# Patient Record
Sex: Female | Born: 1990
Health system: Southern US, Community
[De-identification: ages and names within clinical notes are randomized; demographics above are authoritative.]

## PROBLEM LIST (undated history)

## (undated) DIAGNOSIS — G35 Multiple sclerosis: Secondary | ICD-10-CM

## (undated) HISTORY — PX: NO PAST SURGERIES: SHX2092

---
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
## 2010-09-29 LAB — URINE MICROSCOPIC-ADD ON

## 2010-09-29 LAB — I-STAT 8, (EC8 V) (CONVERTED LAB)
Bicarbonate: 26.1 — ABNORMAL HIGH
HCT: 49 — ABNORMAL HIGH
Hemoglobin: 16.7 — ABNORMAL HIGH
Operator id: 161631
Sodium: 138
TCO2: 28
pCO2, Ven: 47.4

## 2010-09-29 LAB — URINALYSIS, ROUTINE W REFLEX MICROSCOPIC
Glucose, UA: NEGATIVE
Ketones, ur: 15 — AB
Leukocytes, UA: NEGATIVE
Protein, ur: 30 — AB
pH: 5.5

## 2010-09-29 LAB — POCT PREGNANCY, URINE: Operator id: 161631

## 2011-08-19 ENCOUNTER — Emergency Department (HOSPITAL_COMMUNITY): Payer: BC Managed Care – PPO

## 2011-08-19 ENCOUNTER — Emergency Department (HOSPITAL_COMMUNITY)
Admission: EM | Admit: 2011-08-19 | Discharge: 2011-08-19 | Disposition: A | Discharge status: Home / Self Care | Payer: BC Managed Care – PPO | Attending: Emergency Medicine | Admitting: Emergency Medicine

## 2011-08-19 ENCOUNTER — Encounter (HOSPITAL_COMMUNITY): Payer: Self-pay | Admitting: Emergency Medicine

## 2011-08-19 DIAGNOSIS — H538 Other visual disturbances: Secondary | ICD-10-CM | POA: Insufficient documentation

## 2011-08-19 LAB — GLUCOSE, CAPILLARY: Glucose-Capillary: 108 mg/dL — ABNORMAL HIGH (ref 70–99)

## 2011-08-19 MED ORDER — TETRACAINE HCL 0.5 % OP SOLN
1.0000 [drp] | Freq: Once | OPHTHALMIC | Status: AC
  Administered 2011-08-19: 1 [drp] via OPHTHALMIC
  Filled 2011-08-19: qty 2

## 2011-08-19 NOTE — ED Notes (Signed)
Pt sent to ed by her eye doctor for diabetes testing pt states she woke up with blurred vision and went to her eye doctor and he noted vision changes and stated that it might be due to diabetes. Pt denies frequent urination, and thirst

## 2011-08-19 NOTE — ED Notes (Addendum)
Pt reports that she went back to the eye doctor today and he suggested that she come get her BP and CBG checked. Pt reports that she is currently on VIORELE.Pt reports that she tried putting eye drops in her eyes earlier with no relief.Pt denies pain or drainage from either eye. Pt reports a family history of HTN and diabetes.

## 2011-08-19 NOTE — ED Provider Notes (Signed)
History     CSN: [id number]  Arrival date & time 08/19/11  4098   First MD Initiated Contact with Patient 08/19/11 2002      Chief Complaint  Patient presents with  . Blurred Vision    (Consider location/radiation/quality/duration/timing/severity/associated sxs/prior treatment) HPI  A 21 year old female presenting with acute onset of blurred vision upon waking this morning. Patient has had no similar prior episodes. She notes that she had a migraine several days ago which is now resolved patient denies eye pain, redness, discharge. She normally wears glasses and has not worn contacts in the last week. She states that she does have photophobia but that is normal she is very light sensitive at her baseline. Patient went to her optometrist who was concerned that her vision had declining sharply since her last visit 2 months ago and encourage her to present for evaluation.  History reviewed. No pertinent past medical history.  History reviewed. No pertinent past surgical history.  No family history on file.  History  Substance Use Topics  . Smoking status: Never Smoker   . Smokeless tobacco: Not on file  . Alcohol Use: No     occassionally    OB History    Grav Para Term Preterm Abortions TAB SAB Ect Mult Living                  Review of Systems  Constitutional: Negative for fever.  Eyes: Positive for visual disturbance. Negative for photophobia, pain, discharge, redness and itching.  All other systems reviewed and are negative.    Allergies  Review of patient's allergies indicates no known allergies.  Home Medications   Current Outpatient Rx  Name Route Sig Dispense Refill  . ACETAMINOPHEN 500 MG PO TABS Oral Take 1,000 mg by mouth every 6 (six) hours as needed.    . DESOGESTREL-ETHINYL ESTRADIOL 0.15-0.02/0.01 MG (21/5) PO TABS Oral Take 1 tablet by mouth daily.      BP 129/93  Pulse 79  Temp 99.4 F (37.4 C) (Oral)  Resp 18  SpO2 100%  LMP  07/25/2011  Physical Exam  Nursing note and vitals reviewed. Constitutional: She is oriented to person, place, and time. She appears well-developed and well-nourished. No distress.  HENT:  Head: Normocephalic.  Eyes: Conjunctivae and lids are normal. Pupils are equal, round, and reactive to light. Right eye exhibits no chemosis, no discharge, no exudate and no hordeolum. No foreign body present in the right eye. Left eye exhibits no chemosis, no discharge, no exudate and no hordeolum. No foreign body present in the left eye. Right conjunctiva is not injected. Right conjunctiva has no hemorrhage. Left conjunctiva is not injected. Left conjunctiva has no hemorrhage. No scleral icterus. Right eye exhibits nystagmus. Right eye exhibits normal extraocular motion. Left eye exhibits nystagmus. Left eye exhibits normal extraocular motion. Right pupil is round and reactive. Left pupil is round and reactive.  Fundoscopic exam:      The right eye shows no AV nicking, no exudate, no hemorrhage and no papilledema. The right eye shows red reflex.      The left eye shows no AV nicking, no exudate, no hemorrhage and no papilledema. The left eye shows red reflex. Slit lamp exam:      The right eye shows no corneal abrasion, no corneal flare, no corneal ulcer, no foreign body, no hyphema, no hypopyon, no fluorescein uptake and no anterior chamber bulge.       The left eye shows no corneal abrasion,  no corneal flare, no corneal ulcer, no foreign body, no hyphema, no hypopyon, no fluorescein uptake and no anterior chamber bulge.       Patient does not have smooth pursuit, with both vertical and horizontal nystagmus on extraocular movement. Patient states that she has had this all of her life.  OS 20/70 Corrected  OD 20/100 Corrected  .   Cardiovascular: Normal rate.   Pulmonary/Chest: Effort normal.  Musculoskeletal: Normal range of motion.  Neurological: She is alert and oriented to person, place,  and time.  Psychiatric: She has a normal mood and affect.    ED Course  Procedures (including critical care time)  Labs Reviewed  GLUCOSE, CAPILLARY - Abnormal; Notable for the following:    Glucose-Capillary 108 (*)     All other components within normal limits   No results found.   1. Blurred vision, bilateral       MDM  Acute onset of blurred vision this a.m. Patient denies any other symptoms. Eye exam is normal blood glucose is normal CT scan is negative consult from ophthalmologist Dr. Pershing Proud appreciated who recommends seeing her in the morning at his clinic. Shared visit with attending Dr. Freida Busman  Pt verbalized understanding and agrees with care plan. Outpatient follow-up and return precautions given.            Wynetta Emery, PA-C 08/19/11 2309

## 2011-08-19 NOTE — ED Provider Notes (Signed)
Medical screening examination/treatment/procedure(s) were conducted as a shared visit with non-physician practitioner(s) and myself.  I personally evaluated the patient during the encounter  Patient was examined and sclera are normal. Corneas are clear. Funduscopic exam is negative. No eye drainage or discharge. Conjunctivae are normal. Visual acuity noted. Patient will be given referral to ophthalmology in the morning  Toy Baker, MD 08/19/11 2210

## 2011-08-19 NOTE — ED Provider Notes (Signed)
Medical screening examination/treatment/procedure(s) were conducted as a shared visit with non-physician practitioner(s) and myself.  I personally evaluated the patient during the encounter  Toy Baker, MD 08/19/11 [phone]

## 2011-08-19 NOTE — ED Notes (Signed)
Dr Allen at bedside  

## 2011-08-21 ENCOUNTER — Ambulatory Visit
Admission: RE | Admit: 2011-08-21 | Discharge: 2011-08-21 | Disposition: A | Discharge status: Home / Self Care | Payer: BC Managed Care – PPO | Source: Ambulatory Visit | Attending: Ophthalmology | Admitting: Ophthalmology

## 2011-08-21 ENCOUNTER — Other Ambulatory Visit: Payer: Self-pay | Admitting: Ophthalmology

## 2011-08-21 DIAGNOSIS — H547 Unspecified visual loss: Secondary | ICD-10-CM

## 2011-08-21 MED ORDER — GADOBENATE DIMEGLUMINE 529 MG/ML IV SOLN
14.0000 mL | Freq: Once | INTRAVENOUS | Status: AC | PRN
  Administered 2011-08-21: 14 mL via INTRAVENOUS

## 2011-08-27 ENCOUNTER — Other Ambulatory Visit: Payer: Self-pay | Admitting: Neurology

## 2011-08-28 ENCOUNTER — Ambulatory Visit
Admission: RE | Admit: 2011-08-28 | Discharge: 2011-08-28 | Disposition: A | Discharge status: Home / Self Care | Payer: BC Managed Care – PPO | Source: Ambulatory Visit | Attending: Neurology | Admitting: Neurology

## 2011-08-28 ENCOUNTER — Other Ambulatory Visit: Payer: Self-pay | Admitting: Neurology

## 2011-08-28 VITALS — BP 133/87 | HR 79 | Ht 67.0 in | Wt 157.0 lb

## 2011-08-28 LAB — CSF CELL COUNT WITH DIFFERENTIAL
RBC Count, CSF: 0 cu mm
Tube #: 3
WBC, CSF: 0 cu mm (ref 0–5)

## 2011-08-28 LAB — PROTEIN, CSF: Total Protein, CSF: 29 mg/dL (ref 15–45)

## 2011-08-28 NOTE — Progress Notes (Signed)
Two tiger-top tubes of blood drawn per orders from left [facility] space without difficulty; site unremarkable.  Victory Dakin, RN

## 2011-08-29 LAB — ANGIOTENSIN CONVERTING ENZYME, CSF: ACE, CSF: 1 U/L

## 2011-08-29 LAB — CRYPTOCOCCAL ANTIGEN, CSF: Crypto Ag: NEGATIVE

## 2011-08-31 ENCOUNTER — Telehealth: Payer: Self-pay | Admitting: Radiology

## 2011-08-31 LAB — CNS IGG SYNTHESIS RATE, CSF+BLOOD
Albumin, CSF: 12.6 mg/dL (ref 8.0–42.0)
IgG Index, CSF: 0.54 (ref <0.66)
IgG, CSF: 1.9 mg/dL (ref 0.8–7.7)
MS CNS IgG Synthesis Rate: -3 mg/24 h (ref <=3.3)

## 2011-08-31 NOTE — Telephone Encounter (Signed)
Explained back discomfort should go away in a few days, use OTC pain meds and ice to back for this. No other problems mentioned.

## 2011-09-01 LAB — OLIGOCLONAL BANDS, CSF + SERM

## 2012-04-05 ENCOUNTER — Other Ambulatory Visit: Payer: Self-pay | Admitting: Neurology

## 2012-04-05 DIAGNOSIS — H532 Diplopia: Secondary | ICD-10-CM

## 2012-04-13 ENCOUNTER — Ambulatory Visit (INDEPENDENT_AMBULATORY_CARE_PROVIDER_SITE_OTHER): Payer: BC Managed Care – PPO

## 2012-04-13 DIAGNOSIS — H532 Diplopia: Secondary | ICD-10-CM

## 2012-04-14 ENCOUNTER — Telehealth: Payer: Self-pay | Admitting: Neurology

## 2012-04-14 ENCOUNTER — Other Ambulatory Visit: Payer: Self-pay | Admitting: Neurology

## 2012-04-14 DIAGNOSIS — H532 Diplopia: Secondary | ICD-10-CM

## 2012-04-14 NOTE — Procedures (Signed)
[facility]  NEUROIMAGING REPORT   STUDY DATE: 04/13/12 PATIENT NAME: Denise Key DOB: [birth year] MRN: [id number]  ORDERING CLINICIAN: York Spaniel, MD CLINICAL HISTORY: 22 year old female with double vision and abnormal MRI brain.  EXAM: MRI brain (without)  TECHNIQUE: MRI of the brain without contrast was obtained utilizing 5 mm axial slices with T1, T2, T2 flair, T2 star gradient echo and diffusion weighted views.  T1 sagittal and T2 coronal views were obtained. CONTRAST: no IMAGING SITE: [facility] [address]   FINDINGS:  There is a round right mid-temporal lesion (1.8cm), T2 and T2FLAIR hyperintense. No abnormal lesions are seen on diffusion-weighted views to suggest acute ischemia. The cortical sulci, fissures and cisterns are normal in size and appearance. Lateral, third and fourth ventricle are normal in size and appearance. No extra-axial fluid collections are seen. No evidence of mass effect or midline shift.    On sagittal views the posterior fossa, pituitary gland and corpus callosum are unremarkable. No evidence of intracranial hemorrhage on gradient-echo views. The orbits and their contents, paranasal sinuses and calvarium are unremarkable.  Intracranial flow voids are present.  IMPRESSION:  Abnormal MRI brain (without) demonstrating: 1. There is a round right mid-temporal lesion (1.8cm), T2 and T2FLAIR hyperintense. Considerations include autoimmune, inflammatory, post-infectious, or neoplastic etiologies. Unfortunately, patient decline IV contrast. 2. Compared to MRI from 08/21/11, the right temporal lesion is a new finding. The prior, small posterior pontine lesion is no longer seen in the current study. Findings raise possibility of demyelinating disease.   INTERPRETING PHYSICIAN:  Suanne Marker, MD Certified in Neurology, Neurophysiology and Neuroimaging  [facility] [address] [place], [place]  [address] [phone]

## 2012-04-14 NOTE — Telephone Encounter (Signed)
I called patient. The MRI scan of brain was done without contrast only, as the patient refused gadolinium enhancement. The patient appears to have a new right temporal lesion, and the brainstem lesion appears to have improved. The patient likely has multiple sclerosis. I'll get a revisit set up for her to discuss treatments. The patient may be a candidate for MS research.

## 2012-04-26 DIAGNOSIS — Z0289 Encounter for other administrative examinations: Secondary | ICD-10-CM

## 2012-04-28 ENCOUNTER — Telehealth: Payer: Self-pay | Admitting: *Deleted

## 2012-04-28 NOTE — Telephone Encounter (Signed)
I called the patient's grandmother. All of the telephone numbers that I have for the patient are nonfunctional. The grandmother will contact the patient, and the patient will contact our office and give [place] a number that works.

## 2012-04-28 NOTE — Telephone Encounter (Signed)
Patient would like to speak with physician concerning having to be scheduled for appt in July and not sooner.

## 2012-12-16 LAB — OB RESULTS CONSOLE ABO/RH: RH TYPE: POSITIVE

## 2012-12-16 LAB — OB RESULTS CONSOLE RPR: RPR: NONREACTIVE

## 2012-12-16 LAB — OB RESULTS CONSOLE HEPATITIS B SURFACE ANTIGEN: Hepatitis B Surface Ag: NEGATIVE

## 2012-12-16 LAB — OB RESULTS CONSOLE HIV ANTIBODY (ROUTINE TESTING): HIV: NONREACTIVE

## 2012-12-16 LAB — OB RESULTS CONSOLE RUBELLA ANTIBODY, IGM: Rubella: IMMUNE

## 2012-12-16 LAB — OB RESULTS CONSOLE ANTIBODY SCREEN: Antibody Screen: NEGATIVE

## 2012-12-30 LAB — OB RESULTS CONSOLE GC/CHLAMYDIA
Chlamydia: NEGATIVE
Gonorrhea: NEGATIVE

## 2013-01-05 NOTE — L&D Delivery Note (Signed)
Delivery Note At 11:56 PM a viable and healthy female was delivered via Vaginal, Spontaneous Delivery (Presentation: Right Occiput Anterior).  APGAR: 8, 9; weight 9 lb 12.1 oz (4425 g).   Placenta status: Intact, Spontaneous.  Not sent Cord: CAN x 1 reducible  3 vessels with the following complications: None.  Cord pH: none  Anesthesia: Epidural  Episiotomy: none Lacerations: Sulcus;Perineal;2nd degree Suture Repair: 3.0 chromic Est. Blood Loss (mL): 450  Mom to postpartum.  Baby to Couplet care / Skin to Skin.  Noreen Mackintosh A 07/18/2013, 12:58 AM

## 2013-05-04 LAB — OB RESULTS CONSOLE HGB/HCT, BLOOD
HEMATOCRIT: 30 %
HEMOGLOBIN: 10 g/dL

## 2013-05-04 LAB — OB RESULTS CONSOLE PLATELET COUNT: Platelets: 175 10*3/uL

## 2013-06-28 LAB — OB RESULTS CONSOLE GBS: GBS: NEGATIVE

## 2013-07-14 ENCOUNTER — Encounter (HOSPITAL_COMMUNITY): Payer: Self-pay | Admitting: Pharmacist

## 2013-07-14 ENCOUNTER — Other Ambulatory Visit: Payer: Self-pay | Admitting: Obstetrics and Gynecology

## 2013-07-14 DIAGNOSIS — O9989 Other specified diseases and conditions complicating pregnancy, childbirth and the puerperium: Principal | ICD-10-CM

## 2013-07-14 DIAGNOSIS — N949 Unspecified condition associated with female genital organs and menstrual cycle: Secondary | ICD-10-CM

## 2013-07-14 DIAGNOSIS — O99892 Other specified diseases and conditions complicating childbirth: Secondary | ICD-10-CM

## 2013-07-14 DIAGNOSIS — N925 Other specified irregular menstruation: Secondary | ICD-10-CM

## 2013-07-17 ENCOUNTER — Encounter (HOSPITAL_COMMUNITY): Payer: BC Managed Care – PPO | Admitting: Anesthesiology

## 2013-07-17 ENCOUNTER — Inpatient Hospital Stay (HOSPITAL_COMMUNITY)
Admission: AD | Admit: 2013-07-17 | Discharge: 2013-07-19 | DRG: 775 | Disposition: A | Discharge status: Home / Self Care | Payer: BC Managed Care – PPO | Source: Ambulatory Visit | Attending: Obstetrics and Gynecology | Admitting: Obstetrics and Gynecology

## 2013-07-17 ENCOUNTER — Other Ambulatory Visit: Payer: Self-pay | Admitting: Obstetrics and Gynecology

## 2013-07-17 ENCOUNTER — Encounter (HOSPITAL_COMMUNITY): Payer: Self-pay | Admitting: *Deleted

## 2013-07-17 ENCOUNTER — Inpatient Hospital Stay (HOSPITAL_COMMUNITY): Payer: BC Managed Care – PPO | Admitting: Anesthesiology

## 2013-07-17 DIAGNOSIS — D62 Acute posthemorrhagic anemia: Secondary | ICD-10-CM | POA: Diagnosis not present

## 2013-07-17 DIAGNOSIS — D649 Anemia, unspecified: Secondary | ICD-10-CM

## 2013-07-17 DIAGNOSIS — O9903 Anemia complicating the puerperium: Secondary | ICD-10-CM | POA: Diagnosis present

## 2013-07-17 DIAGNOSIS — Z87891 Personal history of nicotine dependence: Secondary | ICD-10-CM

## 2013-07-17 DIAGNOSIS — O99892 Other specified diseases and conditions complicating childbirth: Secondary | ICD-10-CM | POA: Diagnosis present

## 2013-07-17 DIAGNOSIS — IMO0001 Reserved for inherently not codable concepts without codable children: Secondary | ICD-10-CM

## 2013-07-17 DIAGNOSIS — O3660X0 Maternal care for excessive fetal growth, unspecified trimester, not applicable or unspecified: Secondary | ICD-10-CM | POA: Diagnosis present

## 2013-07-17 DIAGNOSIS — G35 Multiple sclerosis: Secondary | ICD-10-CM | POA: Diagnosis present

## 2013-07-17 DIAGNOSIS — O9989 Other specified diseases and conditions complicating pregnancy, childbirth and the puerperium: Secondary | ICD-10-CM

## 2013-07-17 HISTORY — DX: Multiple sclerosis: G35

## 2013-07-17 LAB — POCT FERN TEST: POCT Fern Test: NEGATIVE

## 2013-07-17 LAB — CBC
HCT: 37.4 % (ref 36.0–46.0)
Hemoglobin: 12.3 g/dL (ref 12.0–15.0)
MCH: 25.1 pg — AB (ref 26.0–34.0)
MCHC: 32.9 g/dL (ref 30.0–36.0)
MCV: 76.2 fL — ABNORMAL LOW (ref 78.0–100.0)
PLATELETS: 131 10*3/uL — AB (ref 150–400)
RBC: 4.91 MIL/uL (ref 3.87–5.11)
RDW: 15.1 % (ref 11.5–15.5)
WBC: 10.2 10*3/uL (ref 4.0–10.5)

## 2013-07-17 LAB — RPR

## 2013-07-17 MED ORDER — PHENYLEPHRINE 40 MCG/ML (10ML) SYRINGE FOR IV PUSH (FOR BLOOD PRESSURE SUPPORT)
80.0000 ug | PREFILLED_SYRINGE | INTRAVENOUS | Status: DC | PRN
  Filled 2013-07-17: qty 10
  Filled 2013-07-17: qty 2

## 2013-07-17 MED ORDER — EPHEDRINE 5 MG/ML INJ
10.0000 mg | INTRAVENOUS | Status: DC | PRN
  Filled 2013-07-17: qty 2

## 2013-07-17 MED ORDER — BUTORPHANOL TARTRATE 1 MG/ML IJ SOLN
1.0000 mg | INTRAMUSCULAR | Status: DC | PRN
  Administered 2013-07-17: 1 mg via INTRAVENOUS
  Filled 2013-07-17: qty 1

## 2013-07-17 MED ORDER — LIDOCAINE HCL (PF) 1 % IJ SOLN
30.0000 mL | INTRAMUSCULAR | Status: DC | PRN
  Filled 2013-07-17: qty 30

## 2013-07-17 MED ORDER — LIDOCAINE HCL (PF) 1 % IJ SOLN
INTRAMUSCULAR | Status: DC | PRN
  Administered 2013-07-17 (×2): 4 mL

## 2013-07-17 MED ORDER — DIPHENHYDRAMINE HCL 50 MG/ML IJ SOLN: 12.5000 mg | INTRAMUSCULAR | Status: DC | PRN

## 2013-07-17 MED ORDER — LACTATED RINGERS IV SOLN
INTRAVENOUS | Status: DC
  Administered 2013-07-17 (×2): via INTRAVENOUS

## 2013-07-17 MED ORDER — OXYTOCIN 40 UNITS IN LACTATED RINGERS INFUSION - SIMPLE MED
1.0000 m[IU]/min | INTRAVENOUS | Status: DC
  Administered 2013-07-17: 2 m[IU]/min via INTRAVENOUS

## 2013-07-17 MED ORDER — OXYCODONE-ACETAMINOPHEN 5-325 MG PO TABS
1.0000 | ORAL_TABLET | ORAL | Status: DC | PRN
  Administered 2013-07-18: 1 via ORAL
  Filled 2013-07-17: qty 1

## 2013-07-17 MED ORDER — LACTATED RINGERS IV SOLN
500.0000 mL | INTRAVENOUS | Status: DC | PRN
  Administered 2013-07-17: 300 mL via INTRAVENOUS

## 2013-07-17 MED ORDER — IBUPROFEN 600 MG PO TABS
600.0000 mg | ORAL_TABLET | Freq: Four times a day (QID) | ORAL | Status: DC | PRN
  Administered 2013-07-18: 600 mg via ORAL
  Filled 2013-07-17: qty 1

## 2013-07-17 MED ORDER — FLEET ENEMA 7-19 GM/118ML RE ENEM: 1.0000 | ENEMA | RECTAL | Status: DC | PRN

## 2013-07-17 MED ORDER — EPHEDRINE 5 MG/ML INJ
10.0000 mg | INTRAVENOUS | Status: DC | PRN
Start: 2013-07-17 — End: 2013-07-18
  Filled 2013-07-17: qty 2

## 2013-07-17 MED ORDER — PHENYLEPHRINE 40 MCG/ML (10ML) SYRINGE FOR IV PUSH (FOR BLOOD PRESSURE SUPPORT)
80.0000 ug | PREFILLED_SYRINGE | INTRAVENOUS | Status: DC | PRN
  Filled 2013-07-17: qty 2

## 2013-07-17 MED ORDER — ACETAMINOPHEN 325 MG PO TABS: 650.0000 mg | ORAL_TABLET | ORAL | Status: DC | PRN

## 2013-07-17 MED ORDER — CITRIC ACID-SODIUM CITRATE 334-500 MG/5ML PO SOLN: 30.0000 mL | ORAL | Status: DC | PRN

## 2013-07-17 MED ORDER — ONDANSETRON HCL 4 MG/2ML IJ SOLN: 4.0000 mg | Freq: Four times a day (QID) | INTRAMUSCULAR | Status: DC | PRN

## 2013-07-17 MED ORDER — TERBUTALINE SULFATE 1 MG/ML IJ SOLN: 0.2500 mg | Freq: Once | INTRAMUSCULAR | Status: AC | PRN

## 2013-07-17 MED ORDER — FENTANYL 2.5 MCG/ML BUPIVACAINE 1/10 % EPIDURAL INFUSION (WH - ANES)
14.0000 mL/h | INTRAMUSCULAR | Status: DC | PRN
  Administered 2013-07-17 (×2): 14 mL/h via EPIDURAL
  Filled 2013-07-17 (×2): qty 125

## 2013-07-17 MED ORDER — OXYTOCIN BOLUS FROM INFUSION
500.0000 mL | INTRAVENOUS | Status: DC
  Administered 2013-07-18: 500 mL via INTRAVENOUS

## 2013-07-17 MED ORDER — FENTANYL 2.5 MCG/ML BUPIVACAINE 1/10 % EPIDURAL INFUSION (WH - ANES)
INTRAMUSCULAR | Status: DC | PRN
  Administered 2013-07-17: 15 mL/h via EPIDURAL

## 2013-07-17 MED ORDER — OXYTOCIN 40 UNITS IN LACTATED RINGERS INFUSION - SIMPLE MED
62.5000 mL/h | INTRAVENOUS | Status: DC
  Filled 2013-07-17: qty 1000

## 2013-07-17 MED ORDER — LACTATED RINGERS IV SOLN
500.0000 mL | Freq: Once | INTRAVENOUS | Status: AC
  Administered 2013-07-17: 500 mL via INTRAVENOUS

## 2013-07-17 NOTE — H&P (Signed)
Denise HeraldMecah Key is a 23 y.o. female  G1P0 SBF @ 38 /[redacted] weeks gestation resenting in active labor. Intact membrane. Pt was originally scheduled for primary C/S due to efw 9lb 12 oz on sono last week. Pt now wants to try vaginal delivery. GBS cx neg. MS off meds Maternal Medical History:  Reason for admission: Contractions.   Contractions: Onset was 6-12 hours ago.   Frequency: irregular.   Perceived severity is moderate.    Prenatal complications: no prenatal complications   OB History   Grav Para Term Preterm Abortions TAB SAB Ect Mult Living   1              Past Medical History  Diagnosis Date  . Multiple sclerosis    Past Surgical History  Procedure Laterality Date  . No past surgeries     Family History: family history is not on file. Social History:  reports that she quit smoking about 7 months ago. Her smoking use included Cigarettes. She smoked 0.00 packs per day. She has never used smokeless tobacco. She reports that she does not drink alcohol or use illicit drugs.   Prenatal Transfer Tool  Maternal Diabetes: No Genetic Screening: Normal Maternal Ultrasounds/Referrals: Normal Fetal Ultrasounds or other Referrals:  None Maternal Substance Abuse:  No Significant Maternal Medications:  None Significant Maternal Lab Results:  Lab values include: Group B Strep negative Other Comments:  maternal MS w/ 1st trim Rebif, alpha thal trait  Review of Systems  All other systems reviewed and are negative.   Dilation: 5 Effacement (%): 90 Station: -1 Exam by:: Denise Stern, MD Blood pressure 135/90, pulse 96, temperature 98.1 F (36.7 C), temperature source Oral, resp. rate 20, height 5' 6.5" (1.689 m), weight 79.833 kg (176 lb), last menstrual period 10/19/2012. Maternal Exam:  Uterine Assessment: Contraction strength is moderate.  Contraction frequency is regular.   Abdomen: Patient reports no abdominal tenderness. Estimated fetal weight is 9lb 12 oz.   Fetal  presentation: vertex  Introitus: Normal vulva.   Physical Exam  Constitutional: She is oriented to person, place, and time. She appears well-developed and well-nourished.  HENT:  Head: Atraumatic.  Eyes: EOM are normal.  Neck: Neck supple.  Cardiovascular: Regular rhythm.   Respiratory: Effort normal.  GI: Soft.  Musculoskeletal: Normal range of motion.  Neurological: She is alert and oriented to person, place, and time.  Skin: Skin is warm and dry.  Psychiatric: She has a normal mood and affect.    Prenatal labs: ABO, Rh:  O positive Antibody:  neg Rubella:  Immune RPR:   NR HBsAg:   neg HIV:  nr  GBS:   neg  Assessment/Plan: Active labor Term gestation Fetal macrosomia Multiple sclerosis P) admit routine labs. IV analgesic prn. Amniotomy prn   Denise Key A 07/17/2013, 12:02 PM

## 2013-07-17 NOTE — Anesthesia Preprocedure Evaluation (Signed)
Anesthesia Evaluation  Patient identified by MRN, date of birth, ID band Patient awake    Reviewed: Allergy & Precautions, H&P , NPO status , Patient's Chart, lab work & pertinent test results  Airway Mallampati: II TM Distance: >3 FB Neck ROM: Full    Dental no notable dental hx. (+) Teeth Intact   Pulmonary former smoker,  breath sounds clear to auscultation  Pulmonary exam normal       Cardiovascular negative cardio ROS  Rhythm:Regular Rate:Normal     Neuro/Psych Multiple sclerosis negative psych ROS   GI/Hepatic negative GI ROS, Neg liver ROS,   Endo/Other  negative endocrine ROS  Renal/GU negative Renal ROS  negative genitourinary   Musculoskeletal negative musculoskeletal ROS (+)   Abdominal   Peds  Hematology negative hematology ROS (+)   Anesthesia Other Findings   Reproductive/Obstetrics (+) Pregnancy                           Anesthesia Physical Anesthesia Plan  ASA: II  Anesthesia Plan: Epidural   Post-op Pain Management:    Induction:   Airway Management Planned: Natural Airway  Additional Equipment:   Intra-op Plan:   Post-operative Plan:   Informed Consent: I have reviewed the patients History and Physical, chart, labs and discussed the procedure including the risks, benefits and alternatives for the proposed anesthesia with the patient or authorized representative who has indicated his/her understanding and acceptance.     Plan Discussed with: Anesthesiologist  Anesthesia Plan Comments:         Anesthesia Quick Evaluation

## 2013-07-17 NOTE — Progress Notes (Signed)
Patient ID: Denise Key, female   DOB: July 08, 1990, 23 y.o.   MRN: [id number] Requested to be on stand-by for any emergencies for patient by Dr. Cherly Hensenousins / Meet and greet visit  S: Doing well, pain well-managed with breathing techniques, planning no pain medication for labor/delivery.  Mild pelvic pressure with some contractions.   Ceasar Mons: Filed Vitals:   07/17/13 1108 07/17/13 1111 07/17/13 1205 07/17/13 1232  BP:  135/90 126/82   Pulse:  96 90 99  Temp: 98.1 F (36.7 C)     TempSrc: Oral     Resp: 20  18   Height:      Weight:         FHT:  FHR: 140 bpm, variability: moderate,  accelerations:  Present,  decelerations:  Absent UC:   regular, every 3-5 minutes SVE:   Dilation: 5 Effacement (%): 90 Station: -1 Exam by:: Cherly Hensenousins, MD   A / P: Spontaneous labor, progressing normally Suspected LGA  Fetal Wellbeing:  Category I Pain Control:  Labor support without medications  Anticipated MOD:  NSVD  Kenard GowerAWSON, Muslima Toppins, M, MSN, CNM 07/17/2013, 1:37 PM

## 2013-07-17 NOTE — Anesthesia Procedure Notes (Signed)
Epidural Patient location during procedure: OB Start time: 07/17/2013 4:00 PM  Staffing Anesthesiologist: Hevin Jeffcoat A. Performed by: anesthesiologist   Preanesthetic Checklist Completed: patient identified, site marked, surgical consent, pre-op evaluation, timeout performed, IV checked, risks and benefits discussed and monitors and equipment checked  Epidural Patient position: sitting Prep: site prepped and draped and DuraPrep Patient monitoring: continuous pulse ox and blood pressure Approach: midline Location: L3-L4 Injection technique: LOR air  Needle:  Needle type: Tuohy  Needle gauge: 17 G Needle length: 9 cm and 9 Needle insertion depth: 6 cm Catheter type: closed end flexible Catheter size: 19 Gauge Catheter at skin depth: 11 cm Test dose: negative  Assessment Events: blood not aspirated, injection not painful, no injection resistance, negative IV test and no paresthesia  Additional Notes Patient identified. Risks and benefits discussed including failed block, incomplete  Pain control, post dural puncture headache, nerve damage, paralysis, blood pressure Changes, nausea, vomiting, reactions to medications-both toxic and allergic and post Partum back pain. All questions were answered. Patient expressed understanding and wished to proceed. Sterile technique was used throughout procedure. Epidural site was Dressed with sterile barrier dressing. No paresthesias, signs of intravascular injection Or signs of intrathecal spread were encountered.  Patient was more comfortable after the epidural was dosed. Please see RN's note for documentation of vital signs and FHR which are stable.

## 2013-07-17 NOTE — Progress Notes (Signed)
S: epidural Comfortable  O: VE 7/90/-1 Asynclitic Tracing: baseline 140 ctx q 2-155mins  AROm. Light mec IUPC placed  IMP: fetal macrosomia Protracted active phase due to sub optimal ctx P) start pitocin. Right exaggerated sims position

## 2013-07-17 NOTE — MAU Note (Signed)
Contractions started at 0500, now every .. No bleeding. ? D/c watery mucous.

## 2013-07-17 NOTE — Progress Notes (Signed)
S: comfortable. Some intermittent vaginal pressure  O: Pitocin 6 miu VE 9/90/0 with asynclitic  Tracing baseline 145 min variability Ctx q 2- MV unit 255( last per RN)  IMP: active phase Fetal macrosomia Term gestation  P) cont pitocin. Change to left side with peanut ball

## 2013-07-18 ENCOUNTER — Encounter (HOSPITAL_COMMUNITY): Payer: Self-pay | Admitting: *Deleted

## 2013-07-18 LAB — CBC WITH DIFFERENTIAL/PLATELET
Basophils Absolute: 0 10*3/uL (ref 0.0–0.1)
Basophils Relative: 0 % (ref 0–1)
Eosinophils Absolute: 0 10*3/uL (ref 0.0–0.7)
Eosinophils Relative: 0 % (ref 0–5)
HEMATOCRIT: 32.6 % — AB (ref 36.0–46.0)
HEMOGLOBIN: 10.8 g/dL — AB (ref 12.0–15.0)
Lymphocytes Relative: 7 % — ABNORMAL LOW (ref 12–46)
Lymphs Abs: 1.2 10*3/uL (ref 0.7–4.0)
MCH: 25 pg — AB (ref 26.0–34.0)
MCHC: 33.1 g/dL (ref 30.0–36.0)
MCV: 75.5 fL — ABNORMAL LOW (ref 78.0–100.0)
MONOS PCT: 7 % (ref 3–12)
Monocytes Absolute: 1.1 10*3/uL — ABNORMAL HIGH (ref 0.1–1.0)
NEUTROS PCT: 86 % — AB (ref 43–77)
Neutro Abs: 13.8 10*3/uL — ABNORMAL HIGH (ref 1.7–7.7)
Platelets: 126 10*3/uL — ABNORMAL LOW (ref 150–400)
RBC: 4.32 MIL/uL (ref 3.87–5.11)
RDW: 15 % (ref 11.5–15.5)
WBC: 16 10*3/uL — ABNORMAL HIGH (ref 4.0–10.5)

## 2013-07-18 LAB — ABO/RH: ABO/RH(D): O POS

## 2013-07-18 LAB — HEPATIC FUNCTION PANEL
ALBUMIN: 2.8 g/dL — AB (ref 3.5–5.2)
ALT: 13 U/L (ref 0–35)
AST: 26 U/L (ref 0–37)
Alkaline Phosphatase: 206 U/L — ABNORMAL HIGH (ref 39–117)
BILIRUBIN TOTAL: 0.7 mg/dL (ref 0.3–1.2)
Bilirubin, Direct: <0.2 mg/dL (ref 0.0–0.3)
Total Protein: 6 g/dL (ref 6.0–8.3)

## 2013-07-18 MED ORDER — DIPHENHYDRAMINE HCL 25 MG PO CAPS: 25.0000 mg | ORAL_CAPSULE | Freq: Four times a day (QID) | ORAL | Status: DC | PRN

## 2013-07-18 MED ORDER — OXYCODONE-ACETAMINOPHEN 5-325 MG PO TABS: 1.0000 | ORAL_TABLET | ORAL | Status: DC | PRN

## 2013-07-18 MED ORDER — BENZOCAINE-MENTHOL 20-0.5 % EX AERO
1.0000 "application " | INHALATION_SPRAY | CUTANEOUS | Status: DC | PRN
  Administered 2013-07-18: 1 via TOPICAL
  Filled 2013-07-18: qty 56

## 2013-07-18 MED ORDER — METHYLERGONOVINE MALEATE 0.2 MG PO TABS: 0.2000 mg | ORAL_TABLET | ORAL | Status: DC | PRN

## 2013-07-18 MED ORDER — METHYLERGONOVINE MALEATE 0.2 MG/ML IJ SOLN
0.2000 mg | INTRAMUSCULAR | Status: DC | PRN
  Administered 2013-07-18: 0.2 mg via INTRAMUSCULAR

## 2013-07-18 MED ORDER — METHYLERGONOVINE MALEATE 0.2 MG/ML IJ SOLN: 0.2000 mg | Freq: Once | INTRAMUSCULAR | Status: DC

## 2013-07-18 MED ORDER — ZOLPIDEM TARTRATE 5 MG PO TABS: 5.0000 mg | ORAL_TABLET | Freq: Every evening | ORAL | Status: DC | PRN

## 2013-07-18 MED ORDER — LANOLIN HYDROUS EX OINT: TOPICAL_OINTMENT | CUTANEOUS | Status: DC | PRN

## 2013-07-18 MED ORDER — ONDANSETRON HCL 4 MG/2ML IJ SOLN: 4.0000 mg | INTRAMUSCULAR | Status: DC | PRN

## 2013-07-18 MED ORDER — PRENATAL MULTIVITAMIN CH
1.0000 | ORAL_TABLET | Freq: Every day | ORAL | Status: DC
  Administered 2013-07-19: 1 via ORAL
  Filled 2013-07-18: qty 1

## 2013-07-18 MED ORDER — WITCH HAZEL-GLYCERIN EX PADS: 1.0000 "application " | MEDICATED_PAD | CUTANEOUS | Status: DC | PRN

## 2013-07-18 MED ORDER — IBUPROFEN 600 MG PO TABS
600.0000 mg | ORAL_TABLET | Freq: Four times a day (QID) | ORAL | Status: DC
  Administered 2013-07-18 – 2013-07-19 (×4): 600 mg via ORAL
  Filled 2013-07-18 (×5): qty 1

## 2013-07-18 MED ORDER — SIMETHICONE 80 MG PO CHEW: 80.0000 mg | CHEWABLE_TABLET | ORAL | Status: DC | PRN

## 2013-07-18 MED ORDER — FERROUS SULFATE 325 (65 FE) MG PO TABS
325.0000 mg | ORAL_TABLET | Freq: Two times a day (BID) | ORAL | Status: DC
  Administered 2013-07-18 – 2013-07-19 (×3): 325 mg via ORAL
  Filled 2013-07-18 (×3): qty 1

## 2013-07-18 MED ORDER — SENNOSIDES-DOCUSATE SODIUM 8.6-50 MG PO TABS
2.0000 | ORAL_TABLET | ORAL | Status: DC
  Administered 2013-07-19: 2 via ORAL
  Filled 2013-07-18: qty 2

## 2013-07-18 MED ORDER — DIBUCAINE 1 % RE OINT: 1.0000 "application " | TOPICAL_OINTMENT | RECTAL | Status: DC | PRN

## 2013-07-18 MED ORDER — ONDANSETRON HCL 4 MG PO TABS: 4.0000 mg | ORAL_TABLET | ORAL | Status: DC | PRN

## 2013-07-18 NOTE — Anesthesia Postprocedure Evaluation (Signed)
Anesthesia Post Note  Patient: Systems analyst  Procedure(s) Performed: * No procedures listed *  Anesthesia type: Epidural  Patient location: Mother/Baby  Post pain: Pain level controlled  Post assessment: Post-op Vital signs reviewed  Last Vitals:  Filed Vitals:   07/18/13 0745  BP: 130/89  Pulse: 89  Temp: 37 C  Resp: 18    Post vital signs: Reviewed  Level of consciousness: awake  Complications: No apparent anesthesia complications

## 2013-07-18 NOTE — Lactation Note (Signed)
This note was copied from the chart of Denise Key. Lactation Consultation Note  Patient Name: Denise Bristyl Heidt GYBWL'S Date: 07/18/2013 Reason for consult: Other (Comment) (charting for exclusion)   Maternal Data Formula Feeding for Exclusion: Yes Reason for exclusion: Mother's choice to formula feed on admision  Feeding Feeding Type: Formula  LATCH Score/Interventions                      Lactation Tools Discussed/Used     Consult Status Consult Status: Complete    Lynda Rainwater 07/18/2013, 6:48 PM

## 2013-07-18 NOTE — Progress Notes (Signed)
PPD 1 SVD  S:  Reports feeling well - bottom sore and "prickly"             Tolerating po/ No nausea or vomiting             Bleeding is moderate             Pain controlled with Motrin and percocet             Up ad lib / ambulatory / voiding QS  Newborn BOTTLE feeding  / Circumcision planned  O:               VS: BP 117/76  Pulse 82  Temp(Src) 98.6 F (37 C) (Oral)  Resp 17  Ht 5' 6.5" (1.689 m)  Wt 79.833 kg (176 lb)  BMI 27.98 kg/m2  SpO2 99%  LMP 10/19/2012  Breastfeeding? Unknown   LABS:              Recent Labs  07/17/13 1115 07/18/13 0605  WBC 10.2 16.0*  HGB 12.3 10.8*  PLT 131* 126*               Blood type: --/--/O POS (07/13 1115)  Rubella: Immune (12/12 0000)                            Physical Exam:             Alert and oriented X3  Lungs: Clear and unlabored  Heart: regular rate and rhythm / no mumurs  Abdomen: soft, non-tender, non-distended              Fundus: firm, non-tender, U-1  Perineum: moderate edema / ice pack in place  Lochia: moderate  Extremities: 1+ pedal edema, no calf pain or tenderness    A: PPD # 1   Doing well - stable status  P: Routine post partum orders  Dc tomorrow  Marlinda MikeBAILEY, Yosgar Demirjian CNM, MSN, [facility] 07/18/2013, 6:27 PM

## 2013-07-19 DIAGNOSIS — D649 Anemia, unspecified: Secondary | ICD-10-CM

## 2013-07-19 MED ORDER — IBUPROFEN 600 MG PO TABS: 600.0000 mg | ORAL_TABLET | Freq: Four times a day (QID) | ORAL | Status: AC

## 2013-07-19 NOTE — Discharge Summary (Signed)
Obstetric Discharge Summary Reason for Admission: onset of labor Prenatal Course: LGA, anemia, hx of MS-stable through pregnancy Intrapartum Procedures: spontaneous vaginal delivery and cervical balloon, Pitocin, epidural Postpartum Procedures: none Complications-Operative and Postpartum: 2nd degree perineal laceration and sulcus laceration Hemoglobin  Date Value Ref Range Status  07/18/2013 10.8* 12.0 - 15.0 g/dL Final  1/61/09604/30/2015 45.410.0   Final     HCT  Date Value Ref Range Status  07/18/2013 32.6* 36.0 - 46.0 % Final  05/04/2013 30   Final    Physical Exam:  General: alert and cooperative Lochia: appropriate Uterine Fundus: firm Incision: healing well, no significant drainage, no dehiscence, no significant erythema DVT Evaluation: No evidence of DVT seen on physical exam. Negative Homan's sign. No cords or calf tenderness. No significant calf/ankle edema.  Discharge Diagnoses: Term Pregnancy-delivered  Discharge Information: Date: 07/19/2013 Activity: pelvic rest Diet: routine Medications: PNV, Ibuprofen and Iron Condition: stable Instructions: refer to practice specific booklet Discharge to: home Follow-up Information   Follow up with COUSINS,SHERONETTE A, MD. Schedule an appointment as soon as possible for a visit in 6 weeks.   Specialty:  Obstetrics and Gynecology   Contact information:   [address] [place] [id number] [phone]       Newborn Data: Live born female on 07/17/13 Birth Weight: 9 lb 12.1 oz (4425 g) APGAR: 8, 9  Home with mother.  Gabrielle Mester, N 07/19/2013, 10:50 AM

## 2013-07-19 NOTE — Progress Notes (Signed)
PPD #2- SVD  Subjective:   Reports feeling well Tolerating po/ No nausea or vomiting Bleeding is light Pain controlled with Motrin Up ad lib / ambulatory / voiding without problems Newborn: bottlefeeding  / Circumcision: planning   Objective:   VS: VS:  Filed Vitals:   07/18/13 0345 07/18/13 0745 07/18/13 1727 07/19/13 0500  BP: 124/70 130/89 117/76 116/74  Pulse: 82 89 82 84  Temp: 98.4 F (36.9 C) 98.6 F (37 C) 98.6 F (37 C) 98.3 F (36.8 C)  TempSrc: Oral Oral Oral Oral  Resp: 20 18 17 18   Height:      Weight:      SpO2:        LABS:  Recent Labs  07/17/13 1115 07/18/13 0605  WBC 10.2 16.0*  HGB 12.3 10.8*  PLT 131* 126*   Blood type: --/--/O POS (07/13 1115) Rubella: Immune (12/12 0000)                I&O: Intake/Output     07/14 0701 - 07/15 0700 07/15 0701 - 07/16 0700   I.V. (mL/kg)     Total Intake(mL/kg)     Urine (mL/kg/hr)     Blood     Total Output       Net              Physical Exam: Alert and oriented X3 Abdomen: soft, non-tender, non-distended  Fundus: firm, non-tender, U-1 Perineum: Well approximated, no significant erythema, edema, or drainage; healing well. Lochia: small Extremities: No edema, no calf pain or tenderness    Assessment: PPD # 2 G1P1001/ S/P:spontaneous vaginal Mild ABL anemia Doing well - stable for discharge home   Plan: Discharge home RX's:  Ibuprofen 600mg  po Q 6 hrs prn pain #30 Refill x 0 Prenatal vitamin 1 po daily Routine pp visit in [facility] booklet given    Denise Key, Denise Key, N MSN, CNM 07/19/2013, 10:32 AM

## 2013-07-21 ENCOUNTER — Encounter (HOSPITAL_COMMUNITY)
Admission: AD | Disposition: A | Discharge status: Home / Self Care | Payer: Self-pay | Source: Ambulatory Visit | Attending: Obstetrics and Gynecology

## 2013-07-21 SURGERY — Surgical Case
Anesthesia: Regional

## 2013-07-21 SURGICAL SUPPLY — 41 items
BARRIER ADHS 3X4 INTERCEED (GAUZE/BANDAGES/DRESSINGS) IMPLANT
BENZOIN TINCTURE PRP APPL 2/3 (GAUZE/BANDAGES/DRESSINGS) IMPLANT
CLAMP CORD UMBIL (MISCELLANEOUS) IMPLANT
CLOSURE WOUND 1/2 X4 (GAUZE/BANDAGES/DRESSINGS)
CLOTH BEACON ORANGE TIMEOUT ST (SAFETY) IMPLANT
CONTAINER PREFILL 10% NBF 15ML (MISCELLANEOUS) IMPLANT
DRAPE LG THREE QUARTER DISP (DRAPES) IMPLANT
DRSG OPSITE POSTOP 4X10 (GAUZE/BANDAGES/DRESSINGS) IMPLANT
DURAPREP 26ML APPLICATOR (WOUND CARE) IMPLANT
ELECT REM PT RETURN 9FT ADLT (ELECTROSURGICAL)
ELECTRODE REM PT RTRN 9FT ADLT (ELECTROSURGICAL) IMPLANT
EXTRACTOR VACUUM M CUP 4 TUBE (SUCTIONS) IMPLANT
EXTRACTOR VACUUM M CUP 4' TUBE (SUCTIONS)
GLOVE BIOGEL PI IND STRL 7.0 (GLOVE) IMPLANT
GLOVE BIOGEL PI INDICATOR 7.0 (GLOVE)
GLOVE ECLIPSE 6.5 STRL STRAW (GLOVE) IMPLANT
GOWN STRL REUS W/TWL LRG LVL3 (GOWN DISPOSABLE) IMPLANT
KIT ABG SYR 3ML LUER SLIP (SYRINGE) IMPLANT
NEEDLE HYPO 25X1 1.5 SAFETY (NEEDLE) IMPLANT
NEEDLE HYPO 25X5/8 SAFETYGLIDE (NEEDLE) IMPLANT
NS IRRIG 1000ML POUR BTL (IV SOLUTION) IMPLANT
PACK C SECTION WH (CUSTOM PROCEDURE TRAY) IMPLANT
PAD OB MATERNITY 4.3X12.25 (PERSONAL CARE ITEMS) IMPLANT
RTRCTR C-SECT PINK 25CM LRG (MISCELLANEOUS) IMPLANT
STAPLER VISISTAT 35W (STAPLE) IMPLANT
STRIP CLOSURE SKIN 1/2X4 (GAUZE/BANDAGES/DRESSINGS) IMPLANT
SUT CHROMIC GUT AB #0 18 (SUTURE) IMPLANT
SUT MNCRL 0 VIOLET CTX 36 (SUTURE) IMPLANT
SUT MON AB 4-0 PS1 27 (SUTURE) IMPLANT
SUT MONOCRYL 0 CTX 36 (SUTURE)
SUT PLAIN 2 0 (SUTURE)
SUT PLAIN 2 0 XLH (SUTURE) IMPLANT
SUT PLAIN ABS 2-0 CT1 27XMFL (SUTURE) IMPLANT
SUT VIC AB 0 CT1 36 (SUTURE) IMPLANT
SUT VIC AB 2-0 CT1 27 (SUTURE)
SUT VIC AB 2-0 CT1 TAPERPNT 27 (SUTURE) IMPLANT
SUT VIC AB 4-0 PS2 27 (SUTURE) IMPLANT
SYR CONTROL 10ML LL (SYRINGE) IMPLANT
TOWEL OR 17X24 6PK STRL BLUE (TOWEL DISPOSABLE) IMPLANT
TRAY FOLEY CATH 14FR (SET/KITS/TRAYS/PACK) IMPLANT
WATER STERILE IRR 1000ML POUR (IV SOLUTION) IMPLANT

## 2013-07-25 ENCOUNTER — Other Ambulatory Visit: Payer: Self-pay | Admitting: Obstetrics and Gynecology

## 2013-07-25 DIAGNOSIS — G35 Multiple sclerosis: Secondary | ICD-10-CM

## 2013-08-03 ENCOUNTER — Ambulatory Visit
Admission: RE | Admit: 2013-08-03 | Discharge: 2013-08-03 | Disposition: A | Discharge status: Home / Self Care | Payer: BC Managed Care – PPO | Source: Ambulatory Visit | Attending: Obstetrics and Gynecology | Admitting: Obstetrics and Gynecology

## 2013-08-03 DIAGNOSIS — G35 Multiple sclerosis: Secondary | ICD-10-CM

## 2013-08-03 MED ORDER — GADOBENATE DIMEGLUMINE 529 MG/ML IV SOLN
15.0000 mL | Freq: Once | INTRAVENOUS | Status: AC | PRN
  Administered 2013-08-03: 15 mL via INTRAVENOUS

## 2013-08-08 ENCOUNTER — Telehealth: Payer: Self-pay | Admitting: *Deleted

## 2013-08-08 NOTE — Telephone Encounter (Signed)
Patient needs office visit in next several weeks with Tylene Fantasia.

## 2013-08-09 ENCOUNTER — Encounter: Payer: Self-pay | Admitting: *Deleted

## 2013-08-09 NOTE — Telephone Encounter (Signed)
Called pt and left message for pt to call back to set up an appt with Aundra Millet, NP.

## 2013-11-06 ENCOUNTER — Encounter (HOSPITAL_COMMUNITY): Payer: Self-pay | Admitting: *Deleted

## 2014-01-18 ENCOUNTER — Encounter (HOSPITAL_COMMUNITY): Payer: Self-pay | Admitting: Obstetrics and Gynecology

## 2014-02-07 ENCOUNTER — Other Ambulatory Visit (HOSPITAL_COMMUNITY)
Admission: RE | Admit: 2014-02-07 | Discharge: 2014-02-07 | Disposition: A | Discharge status: Home / Self Care | Payer: BLUE CROSS/BLUE SHIELD | Source: Ambulatory Visit | Attending: Family Medicine | Admitting: Family Medicine

## 2014-02-07 DIAGNOSIS — Z113 Encounter for screening for infections with a predominantly sexual mode of transmission: Secondary | ICD-10-CM | POA: Diagnosis present

## 2014-02-07 DIAGNOSIS — N760 Acute vaginitis: Secondary | ICD-10-CM | POA: Diagnosis present

## 2014-06-14 ENCOUNTER — Encounter (HOSPITAL_COMMUNITY): Payer: Self-pay | Admitting: Obstetrics and Gynecology

## 2016-01-19 IMAGING — MR MR CERVICAL SPINE WO/W CM
18 of 21 series · 34 of 48 positions shown · IV contrast (multihance)
Comparison: No cervical spine MRI comparison.

CLINICAL DATA: Multiple sclerosis.

EXAM:
MRI CERVICAL SPINE WITHOUT AND WITH CONTRAST
TECHNIQUE: Multiplanar and multiecho pulse sequences of the cervical spine, to
include the craniocervical junction and cervicothoracic junction,
were obtained according to standard protocol without and with
intravenous contrast.
CONTRAST:  15 mL MultiHance.

[Series 2: T1 · sagittal · 5.0mm · 0.45mm/px · 1 of 21 slices shown (1 of 3)]
[im 1/21]
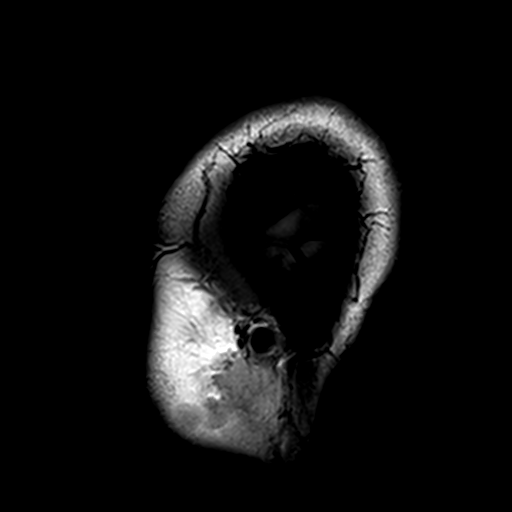

[Series 3: DWI · axial · 5.0mm · 1.80mm/px · z∈[-74,+68]mm · 2 of 44 slices shown (1 of 4)]
[im 1/44]
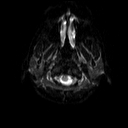
[im 44/44]
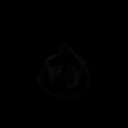

[Series 4: DWI · axial · 5.0mm · 1.80mm/px · 1 of 21 slices shown (2 of 4)]
[im 1/21]
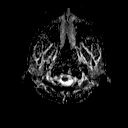

[Series 6: swi_images · axial · 2.0mm · 0.90mm/px · z∈[-82,+76]mm · 5 of 80 slices shown]
[im 1/80]
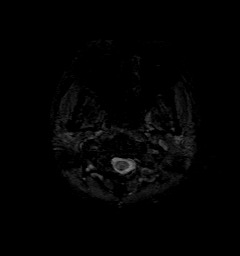
[im 20/80]
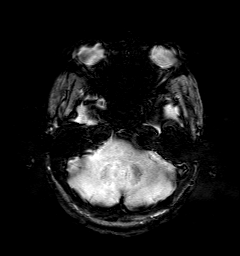
[im 40/80]
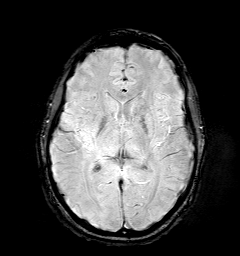
[im 60/80]
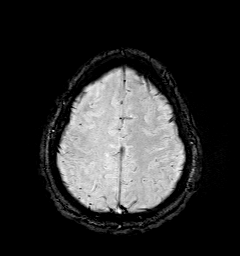
[im 80/80]
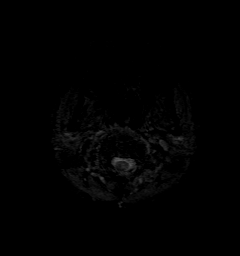

[Series 7: FLAIR · sagittal · 5.0mm · 0.45mm/px · 1 of 22 slices shown (1 of 2)]
[im 1/22]
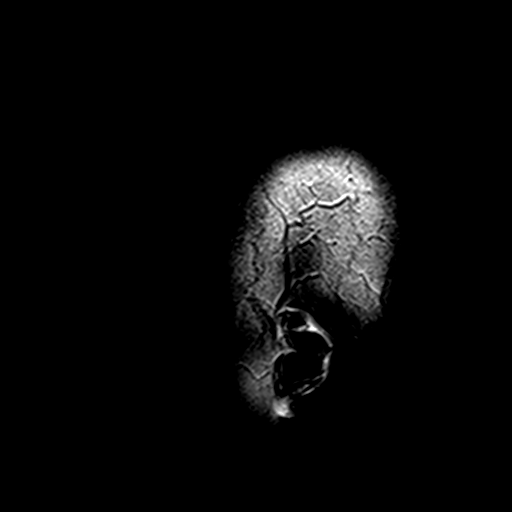

[Series 8: DWI · coronal · 5.0mm · 1.80mm/px · 4 of 67 slices shown (3 of 4)]
[im 1/67]
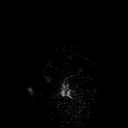
[im 23/67]
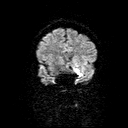
[im 45/67]
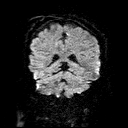
[im 67/67]
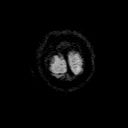

[Series 9: DWI · coronal · 5.0mm · 1.80mm/px · 2 of 34 slices shown (4 of 4)]
[im 1/34]
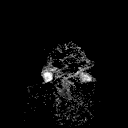
[im 34/34]
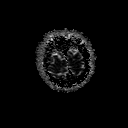

[Series 10: T2 · axial · 5.0mm · 0.51mm/px · z∈[-74,+68]mm · 2 of 22 slices shown (1 of 5)]
[im 1/22]
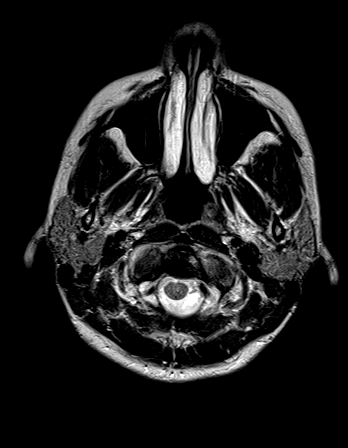
[im 22/22]
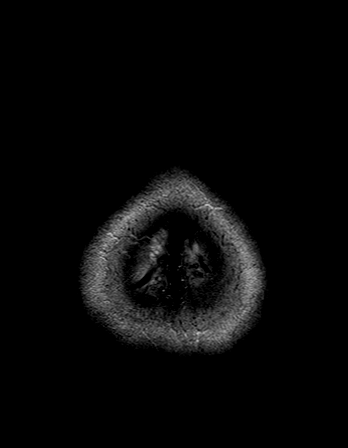

[Series 11: FLAIR · axial · 5.0mm · 0.45mm/px · z∈[-74,+68]mm · 2 of 22 slices shown (2 of 2)]
[im 1/22]
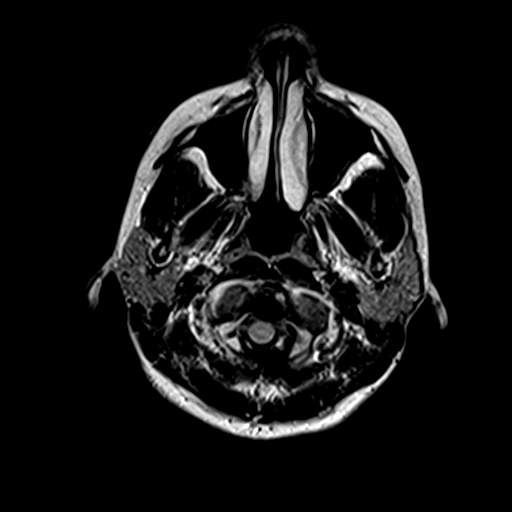
[im 22/22]
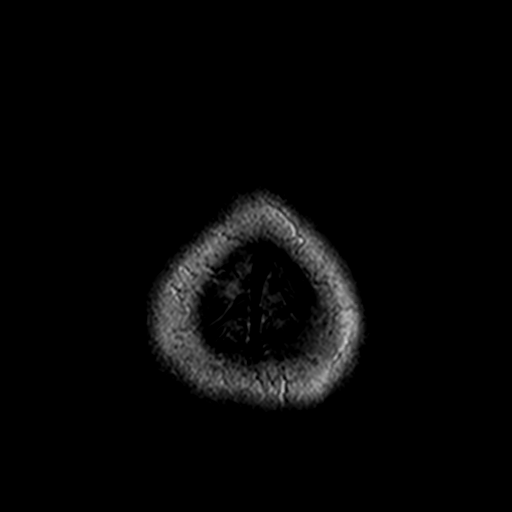

[Series 15: T2 · sagittal · 3.0mm · 0.41mm/px · 1 of 12 slices shown (2 of 5)]
[im 1/12]
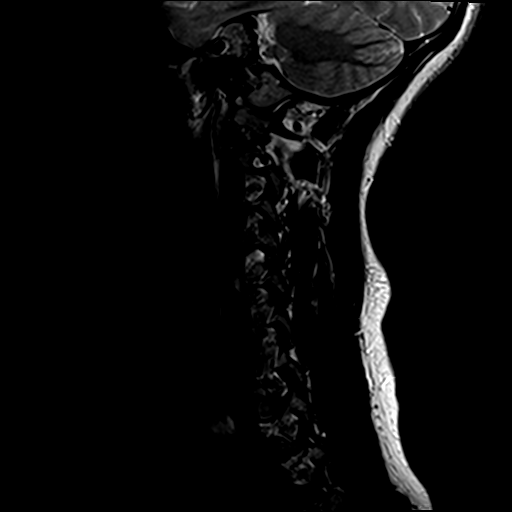

[Series 16: T1 · sagittal · 3.0mm · 0.41mm/px · 1 of 12 slices shown (2 of 3)]
[im 1/12]
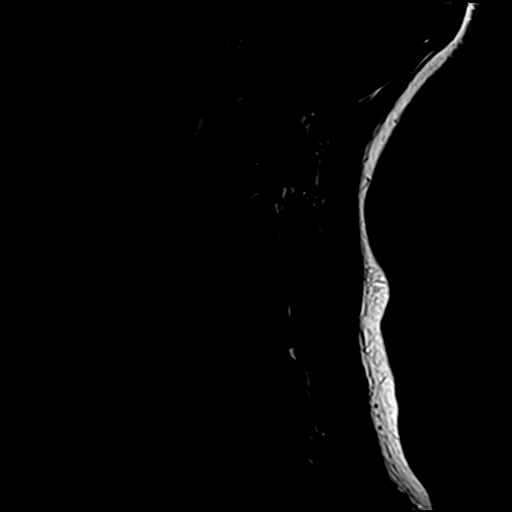

[Series 17: STIR · sagittal · 3.0mm · 0.82mm/px · 1 of 12 slices shown]
[im 1/12]
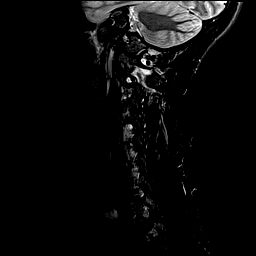

[Series 18: T2 · axial · 3.0mm · 0.39mm/px · z∈[-213,-120]mm · 2 of 26 slices shown (3 of 5)]
[im 1/26]
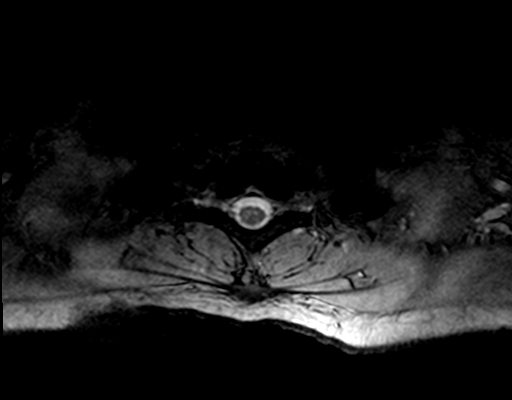
[im 26/26]
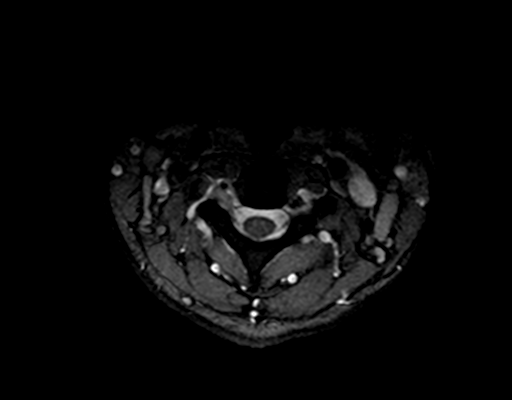

[Series 19: T2 · axial · 3.0mm · 0.39mm/px · z∈[-214,-121]mm · 2 of 26 slices shown (4 of 5)]
[im 1/26]
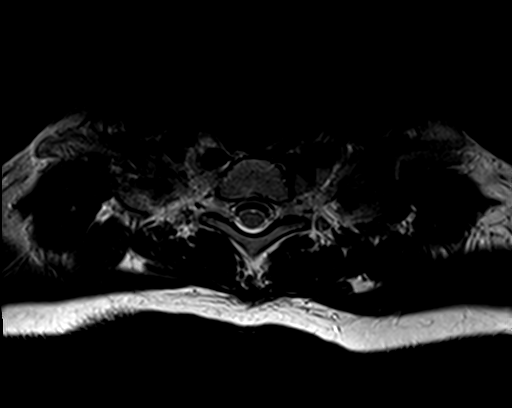
[im 26/26]
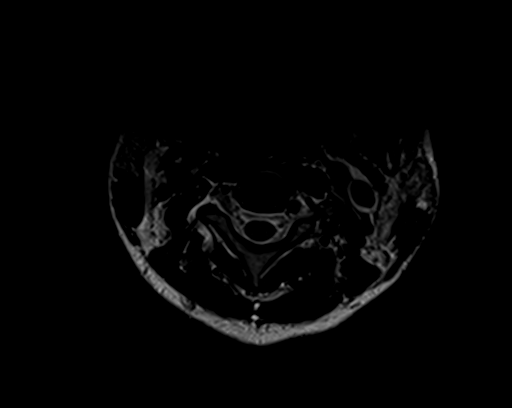

[Series 20: T1 · axial · 3.0mm · 0.31mm/px · z∈[-214,-121]mm · 2 of 26 slices shown (3 of 3)]
[im 1/26]
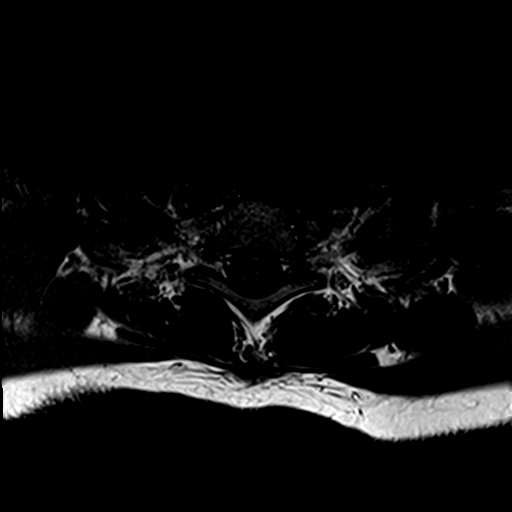
[im 26/26]
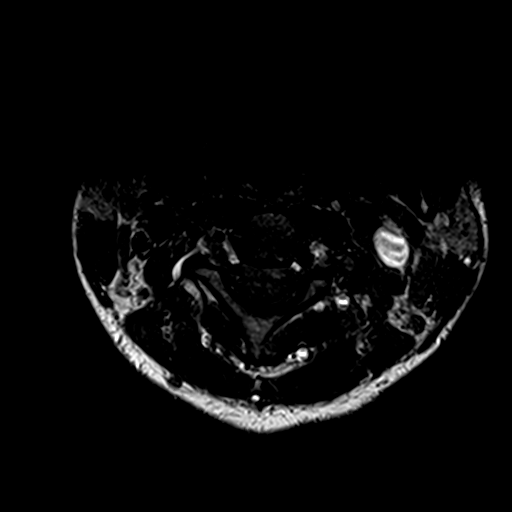

[Series 21: T1 fat-sat post-contrast · sagittal · 3.0mm · 0.82mm/px · 1 of 12 slices shown]
[im 1/12]
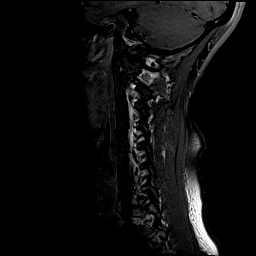

[Series 22: T1 post-contrast · axial · 3.0mm · 0.31mm/px · z∈[-214,-121]mm · 2 of 26 slices shown]
[im 1/26]
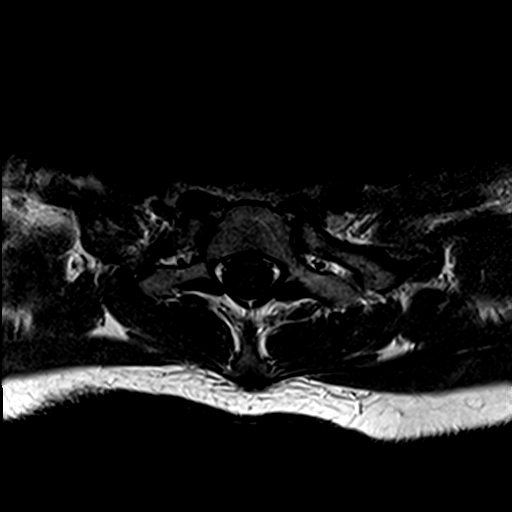
[im 26/26]
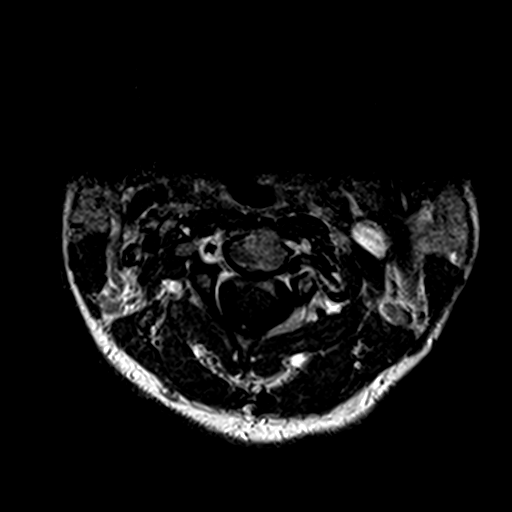

[Series 25: T2 · coronal · 5.0mm · 0.45mm/px · 2 of 25 slices shown (5 of 5)]
[im 1/25]
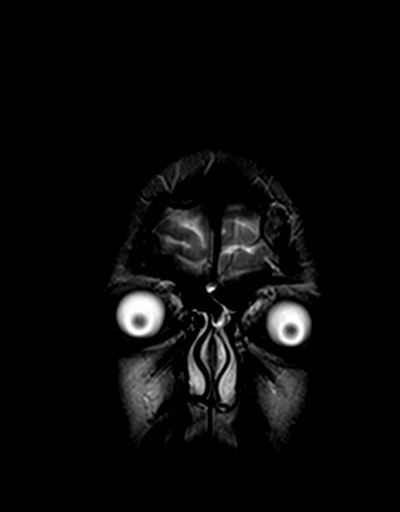
[im 25/25]
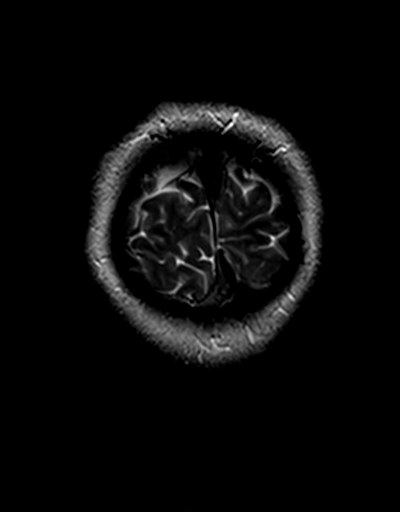

[34 of 48 positions shown; findings below may reference images not displayed]

FINDINGS: Alignment: Straightening of the normal cervical lordosis.

Vertebrae: Normal marrow signal.  No destructive osseous lesions.

Cord: Normal. No demyelinating lesions or evidence of active or
previous multiple sclerosis plaques in the cervical cord. No
enhancement after contrast administration.

Posterior Fossa: Mild cerebellar tonsillar ectopia. No Chiari
malformation. No cord syrinx.

Vertebral Arteries: Normal flow voids present bilaterally.

Paraspinal tissues: Normal.

Disc levels:

C2-C3:  Negative.

C3-C4:  Negative.

C4-C5:  Minimal disc desiccation and tiny disc bulge.  No stenosis.

C5-C6:  Minimal disc bulge.  Disc desiccation.  No stenosis.

C6-C7:  Negative.

C7-T1:  Negative.
IMPRESSION: Normal MRI of the cervical spine without contrast. No myelomalacia
or active demyelinating lesions of the cord.

## 2016-03-16 ENCOUNTER — Emergency Department (HOSPITAL_COMMUNITY)
Admission: EM | Admit: 2016-03-16 | Discharge: 2016-03-17 | Disposition: A | Discharge status: Home / Self Care | Payer: BLUE CROSS/BLUE SHIELD | Attending: Emergency Medicine | Admitting: Emergency Medicine

## 2016-03-16 ENCOUNTER — Encounter (HOSPITAL_COMMUNITY): Payer: Self-pay | Admitting: Emergency Medicine

## 2016-03-16 DIAGNOSIS — R21 Rash and other nonspecific skin eruption: Secondary | ICD-10-CM

## 2016-03-16 DIAGNOSIS — Z79899 Other long term (current) drug therapy: Secondary | ICD-10-CM | POA: Diagnosis not present

## 2016-03-16 DIAGNOSIS — Z87891 Personal history of nicotine dependence: Secondary | ICD-10-CM | POA: Diagnosis not present

## 2016-03-16 MED ORDER — PREDNISONE 20 MG PO TABS: ORAL_TABLET | ORAL | 0 refills | Status: DC

## 2016-03-16 NOTE — ED Provider Notes (Signed)
[facility]-EMERGENCY DEPT Provider Note   CSN: [id number] Arrival date & time: 03/16/16  2309     History   Chief Complaint Chief Complaint  Patient presents with  . Rash    HPI Denise Key is a 26 y.o. female.   Rash   This is a new problem. The problem is associated with an unknown factor. There has been no fever. The rash is present on the torso, left arm, right arm, right lower leg and left lower leg. The patient is experiencing no pain. Associated symptoms include itching. She has tried anti-itch cream for the symptoms. The treatment provided no relief.    Past Medical History:  Diagnosis Date  . Multiple sclerosis (HCC)   . Postpartum care following vaginal delivery (7/13) 07/18/2013    Patient Active Problem List   Diagnosis Date Noted  . Anemia 07/19/2013  . Postpartum care following vaginal delivery (7/13) 07/18/2013  . Active labor at term 07/17/2013    Past Surgical History:  Procedure Laterality Date  . NO PAST SURGERIES      OB History    Gravida Para Term Preterm AB Living   1 1 1     1    SAB TAB Ectopic Multiple Live Births           1       Home Medications    Prior to Admission medications   Medication Sig Start Date End Date Taking? Authorizing Provider  Cholecalciferol (VITAMIN D) 2000 UNITS tablet Take 2,000 Units by mouth daily.    Historical Provider, MD  Ferrous Sulfate Dried (SLOW RELEASE IRON) 140 (45 FE) MG TBCR Take 1 tablet by mouth 2 (two) times daily.    Historical Provider, MD  ibuprofen (ADVIL,MOTRIN) 600 MG tablet Take 1 tablet (600 mg total) by mouth every 6 (six) hours. 07/19/13   Donette Larry, CNM  Prenatal Vit-Fe Fumarate-FA (PRENATAL MULTIVITAMIN) TABS tablet Take 1 tablet by mouth daily at 12 noon.    Historical Provider, MD    Family History No family history on file.  Social History Social History  Substance Use Topics  . Smoking status: Former Smoker    Types: Cigarettes    Quit date: 11/27/2012  .  Smokeless tobacco: Never Used  . Alcohol use Yes     Comment: occassionally     Allergies   Patient has no known allergies.   Review of Systems Review of Systems  Skin: Positive for itching and rash.  All other systems reviewed and are negative.    Physical Exam Updated Vital Signs BP 134/100 (BP Location: Left Arm)   Pulse 73   Temp 98.1 F (36.7 C) (Oral)   Resp 16   Ht 5\' 7"  (1.702 m)   Wt 74.4 kg   LMP 03/11/2016   SpO2 100%   BMI 25.69 kg/m   Physical Exam  Constitutional: She is oriented to person, place, and time. She appears well-developed and well-nourished. No distress.  HENT:  Head: Normocephalic.  Eyes: Conjunctivae are normal.  Neck: Neck supple.  Cardiovascular: Normal rate and regular rhythm.   Pulmonary/Chest: Effort normal and breath sounds normal.  Abdominal: Soft. Bowel sounds are normal.  Musculoskeletal: She exhibits no edema or tenderness.  Neurological: She is alert and oriented to person, place, and time.  Skin: Skin is warm and dry. Capillary refill takes less than 2 seconds. Rash noted.  Psychiatric: She has a normal mood and affect.  Nursing note and vitals reviewed.    ED  Treatments / Results  Labs (all labs ordered are listed, but only abnormal results are displayed) Labs Reviewed - No data to display  EKG  EKG Interpretation None       Radiology No results found.  Procedures Procedures (including critical care time)  Medications Ordered in ED Medications - No data to display   Initial Impression / Assessment and Plan / ED Course  I have reviewed the triage vital signs and the nursing notes.  Pertinent labs & imaging results that were available during my care of the patient were reviewed by me and considered in my medical decision making (see chart for details).     Patient with nonspecific eruption. No signs of infection. Discharge with symptomatic treatment. Follow up with PCP in 2-3 days.      Final  Clinical Impressions(s) / ED Diagnoses   Final diagnoses:  Rash    New Prescriptions Discharge Medication List as of 03/16/2016 11:54 PM    START taking these medications   Details  predniSONE (DELTASONE) 20 MG tablet 3 tabs po day one, then 2 po daily x 4 days, Print         Felicie Morn, NP 03/17/16 0028    Loren Racer, MD 03/17/16 [phone]

## 2016-03-16 NOTE — ED Triage Notes (Addendum)
Pt from home with complaints of bumps on her back, a few bumps on both arms, and a few bumps on her legs. Pt states the bumps itch after she showers, but they do not bother her otherwise. Pt states no one else at her home has had similar symptoms. Pt denies new foods, lotions, or soaps. Pt denies sob or difficulty breathing. Pt states she has been told she has hypertension (about 3 months ago) but does not take anything for it. Pt states she has used hydrocortisone on her skin

## 2017-07-29 ENCOUNTER — Other Ambulatory Visit: Payer: Self-pay | Admitting: Family Medicine

## 2017-07-29 DIAGNOSIS — N6310 Unspecified lump in the right breast, unspecified quadrant: Secondary | ICD-10-CM

## 2017-08-16 ENCOUNTER — Ambulatory Visit
Admission: RE | Admit: 2017-08-16 | Discharge: 2017-08-16 | Disposition: A | Discharge status: Home / Self Care | Payer: BLUE CROSS/BLUE SHIELD | Source: Ambulatory Visit | Attending: Family Medicine | Admitting: Family Medicine

## 2017-08-16 DIAGNOSIS — N6310 Unspecified lump in the right breast, unspecified quadrant: Secondary | ICD-10-CM

## 2019-02-10 ENCOUNTER — Ambulatory Visit (INDEPENDENT_AMBULATORY_CARE_PROVIDER_SITE_OTHER): Payer: 59

## 2019-02-10 ENCOUNTER — Encounter (HOSPITAL_COMMUNITY): Payer: Self-pay

## 2019-02-10 ENCOUNTER — Other Ambulatory Visit: Payer: Self-pay

## 2019-02-10 ENCOUNTER — Ambulatory Visit (HOSPITAL_COMMUNITY)
Admission: EM | Admit: 2019-02-10 | Discharge: 2019-02-10 | Disposition: A | Discharge status: Home / Self Care | Payer: 59 | Attending: Family Medicine | Admitting: Family Medicine

## 2019-02-10 DIAGNOSIS — N644 Mastodynia: Secondary | ICD-10-CM

## 2019-02-10 DIAGNOSIS — R0789 Other chest pain: Secondary | ICD-10-CM

## 2019-02-10 DIAGNOSIS — M79622 Pain in left upper arm: Secondary | ICD-10-CM | POA: Diagnosis not present

## 2019-02-10 NOTE — ED Provider Notes (Signed)
[facility]    CSN: [id number] Arrival date & time: 02/10/19  1813      History   Chief Complaint Chief Complaint  Patient presents with  . Back Pain  . axilla pain     HPI Denise Key is a 29 y.o. female.   Denise Key presents with complaints of symptoms which started 2/1, pain under left armpit. Pain then radiated to left posterior shoulder. Tylenol seemed to help the past few days. Certain movements seem to worsen it. While working this afternoon it worsened again. She is left handed.  Now also pain to left lateral breast. Tylenol has helped. Hasn't felt any particular swelling/ mass/ lump. Still can feel some pain with deep breathing but it has significantly improved with tylenol. No shortness of breath. On oral birth control. No recent travel. No leg pain or swelling. Had an episode after an MRI of irregular heart beating, normal ekg with PCP and is to schedule an echo for follow up. No further episodes of palpitations. No previous mammogram. No URI symptoms. No dental pain. No fevers. Denies any previous similar. Can have some breast tenderness with menstruation but this has felt very different.  History  Of MS   ROS per HPI, negative if not otherwise mentioned.      Past Medical History:  Diagnosis Date  . Multiple sclerosis ([place])   . Postpartum care following vaginal delivery (7/13) 07/18/2013    Patient Active Problem List   Diagnosis Date Noted  . Anemia 07/19/2013  . Postpartum care following vaginal delivery (7/13) 07/18/2013  . Active labor at term 07/17/2013    Past Surgical History:  Procedure Laterality Date  . NO PAST SURGERIES      OB History    Gravida  1   Para  1   Term  1   Preterm      AB      Living  1     SAB      TAB      Ectopic      Multiple      Live Births  1            Home Medications    Prior to Admission medications   Medication Sig Start Date End Date Taking? Authorizing  Provider  MAGNESIUM PO Take by mouth.   Yes [provider]  amLODipine (NORVASC) 2.5 MG tablet Take 2.5 mg by mouth daily. 12/18/18   [provider]  Cholecalciferol (VITAMIN D) 2000 UNITS tablet Take 2,000 Units by mouth daily.    [provider]  Ferrous Sulfate Dried (SLOW RELEASE IRON) 140 (45 FE) MG TBCR Take 1 tablet by mouth 2 (two) times daily.    [provider]  ibuprofen (ADVIL,MOTRIN) 600 MG tablet Take 1 tablet (600 mg total) by mouth every 6 (six) hours. 07/19/13   Julianne Handler, CNM  ocrelizumab (OCREVUS) 300 MG/10ML injection Inject into the vein.    [provider]  Omega-3 1000 MG CAPS Take by mouth.    [provider]  predniSONE (DELTASONE) 20 MG tablet 3 tabs po day one, then 2 po daily x 4 days 03/16/16   Etta Quill, NP  Prenatal Vit-Fe Fumarate-FA (PRENATAL MULTIVITAMIN) TABS tablet Take 1 tablet by mouth daily at 12 noon.    [provider]  Marilu Favre 756-43 MCG/24HR transdermal patch  02/09/19   [provider]    Family History History reviewed. No pertinent family history.  Social History  Social History   Tobacco Use  . Smoking status: Former Smoker    Types: Cigarettes    Quit date: 11/27/2012    Years since quitting: 6.2  . Smokeless tobacco: Never Used  Substance Use Topics  . Alcohol use: Yes    Comment: occassionally  . Drug use: No     Allergies   Patient has no known allergies.   Review of Systems Review of Systems   Physical Exam Triage Vital Signs ED Triage Vitals  Enc Vitals Group     BP 02/10/19 1914 (!) 157/102     Pulse --      Resp 02/10/19 1914 18     Temp 02/10/19 1914 97.8 F (36.6 C)     Temp src --      SpO2 02/10/19 1914 100 %     Weight --      Height --      Head Circumference --      Peak Flow --      Pain Score 02/10/19 1910 9     Pain Loc --      Pain Edu? --      Excl. in GC? --    No data found.  Updated Vital Signs BP (!) 146/96  (BP Location: Left Arm)   Pulse 79   Temp 97.8 F (36.6 C)   Resp 18   LMP 02/07/2019 (Exact Date)   SpO2 100%    Physical Exam Constitutional:      General: She is not in acute distress.    Appearance: She is well-developed.  Cardiovascular:     Rate and Rhythm: Normal rate.  Pulmonary:     Effort: Pulmonary effort is normal.  Chest:     Chest wall: Tenderness present. No mass.       Comments: Left proximal breast with point tenderness on palpation without palpable abnormality; without obvious axillary lymphadenopathy Skin:    General: Skin is warm and dry.  Neurological:     Mental Status: She is alert and oriented to person, place, and time.      UC Treatments / Results  Labs (all labs ordered are listed, but only abnormal results are displayed) Labs Reviewed - No data to display  EKG   Radiology DG Chest 2 View  Result Date: 02/10/2019 CLINICAL DATA:  Left-sided chest pain, no known injury EXAM: CHEST - 2 VIEW COMPARISON:  None. FINDINGS: The heart size and mediastinal contours are within normal limits. Both lungs are clear. The visualized skeletal structures are unremarkable. IMPRESSION: No acute abnormality of the lungs. Electronically Signed   By: Lauralyn Primes M.D.   On: 02/10/2019 19:49    Procedures Procedures (including critical care time)  Medications Ordered in UC Medications - No data to display  Initial Impression / Assessment and Plan / UC Course  I have reviewed the triage vital signs and the nursing notes.  Pertinent labs & imaging results that were available during my care of the patient were reviewed by me and considered in my medical decision making (see chart for details).     Xray without acute findings. No tachycardia. No shortness of breath or difficulty breathing. No hypoxia. Tylenol has been helping with pain. No known injury. No palpable masses noted. No fevers or URI symptoms. Lymphadenopathy vs strain considered. Pain management  recommended and order for mammography to rule out abnormal findings sent. Return precautions provided. Patient verbalized understanding and agreeable to plan.   Final Clinical Impressions(s) /  UC Diagnoses   Final diagnoses:  Axillary pain, left  Breast pain, left     Discharge Instructions     Muscle strain to the area vs lymph node swelling vs other abnormality are considered tonight with your symptoms.  I am glad that tylenol helps with your pain.  Your chest xray is normal tonight which is reassuring.  [facility] to confirm all looks well. They will call you to set up appointment time, next week.  Tylenol 1000mg  every 8 hours alternating with ibuprofen 800mg  every 8 hours to help with pain.  If you develop fevers, redness, warmth, shortness of breath , worsening of pain, please return to be seen.     ED Prescriptions    None     PDMP not reviewed this encounter.   , NP 02/10/19 2007

## 2019-02-10 NOTE — Discharge Instructions (Signed)
Muscle strain to the area vs lymph node swelling vs other abnormality are considered tonight with your symptoms.  I am glad that tylenol helps with your pain.  Your chest xray is normal tonight which is reassuring.  [facility] to confirm all looks well. They will call you to set up appointment time, next week.  Tylenol 1000mg  every 8 hours alternating with ibuprofen 800mg  every 8 hours to help with pain.  If you develop fevers, redness, warmth, shortness of breath , worsening of pain, please return to be seen.

## 2019-02-10 NOTE — ED Triage Notes (Addendum)
Pt reports having left sided upper back pain x 4 days, radiates to left axilla. Pt reports the pain is worse when she takes a deep breath. Tylenol give relief.

## 2019-02-13 ENCOUNTER — Other Ambulatory Visit: Payer: Self-pay | Admitting: Emergency Medicine

## 2019-02-13 DIAGNOSIS — N644 Mastodynia: Secondary | ICD-10-CM

## 2019-12-20 ENCOUNTER — Other Ambulatory Visit: Payer: Self-pay | Admitting: Nurse Practitioner

## 2019-12-20 DIAGNOSIS — U071 COVID-19: Secondary | ICD-10-CM

## 2019-12-20 DIAGNOSIS — G35 Multiple sclerosis: Secondary | ICD-10-CM

## 2019-12-20 NOTE — Progress Notes (Signed)
I connected by phone with Denise Key on 12/20/2019 at 11:51 AM to discuss the potential use of a new treatment for mild to moderate COVID-19 viral infection in non-hospitalized patients.  This patient is a 29 y.o. female that meets the FDA criteria for Emergency Use Authorization of COVID monoclonal antibody casirivimab/imdevimab, bamlanivimab/etesevimab, or sotrovimab.  Has a (+) direct SARS-CoV-2 viral test result  Has mild or moderate COVID-19   Is NOT hospitalized due to COVID-19  Is within 10 days of symptom onset  Has at least one of the high risk factor(s) for progression to severe COVID-19 and/or hospitalization as defined in EUA.  Specific high risk criteria : Immunosuppressive Disease or Treatment and Neurodevelopmental disorder   I have spoken and communicated the following to the patient or parent/caregiver regarding COVID monoclonal antibody treatment:  1. FDA has authorized the emergency use for the treatment of mild to moderate COVID-19 in adults and pediatric patients with positive results of direct SARS-CoV-2 viral testing who are 26 years of age and older weighing at least 40 kg, and who are at high risk for progressing to severe COVID-19 and/or hospitalization.  2. The significant known and potential risks and benefits of COVID monoclonal antibody, and the extent to which such potential risks and benefits are unknown.  3. Information on available alternative treatments and the risks and benefits of those alternatives, including clinical trials.  4. Patients treated with COVID monoclonal antibody should continue to self-isolate and use infection control measures (e.g., wear mask, isolate, social distance, avoid sharing personal items, clean and disinfect "high touch" surfaces, and frequent handwashing) according to CDC guidelines.   5. The patient or parent/caregiver has the option to accept or refuse COVID monoclonal antibody treatment.  After reviewing this  information with the patient, the patient has agreed to receive one of the available covid 19 monoclonal antibodies and will be provided an appropriate fact sheet prior to infusion. Mayra Reel, NP 12/20/2019 11:51 AM

## 2019-12-21 ENCOUNTER — Ambulatory Visit (HOSPITAL_COMMUNITY)
Admission: RE | Admit: 2019-12-21 | Discharge: 2019-12-21 | Disposition: A | Discharge status: Home / Self Care | Payer: 59 | Source: Ambulatory Visit | Attending: Pulmonary Disease | Admitting: Pulmonary Disease

## 2019-12-21 ENCOUNTER — Other Ambulatory Visit (HOSPITAL_COMMUNITY): Payer: Self-pay

## 2019-12-21 DIAGNOSIS — G35 Multiple sclerosis: Secondary | ICD-10-CM

## 2019-12-21 DIAGNOSIS — U071 COVID-19: Secondary | ICD-10-CM | POA: Insufficient documentation

## 2019-12-21 MED ORDER — FAMOTIDINE IN NACL 20-0.9 MG/50ML-% IV SOLN: 20.0000 mg | Freq: Once | INTRAVENOUS | Status: DC | PRN

## 2019-12-21 MED ORDER — DIPHENHYDRAMINE HCL 50 MG/ML IJ SOLN: 50.0000 mg | Freq: Once | INTRAMUSCULAR | Status: DC | PRN

## 2019-12-21 MED ORDER — ALBUTEROL SULFATE HFA 108 (90 BASE) MCG/ACT IN AERS: 2.0000 | INHALATION_SPRAY | Freq: Once | RESPIRATORY_TRACT | Status: DC | PRN

## 2019-12-21 MED ORDER — SODIUM CHLORIDE 0.9 % IV SOLN: INTRAVENOUS | Status: DC | PRN

## 2019-12-21 MED ORDER — METHYLPREDNISOLONE SODIUM SUCC 125 MG IJ SOLR: 125.0000 mg | Freq: Once | INTRAMUSCULAR | Status: DC | PRN

## 2019-12-21 MED ORDER — EPINEPHRINE 0.3 MG/0.3ML IJ SOAJ: 0.3000 mg | Freq: Once | INTRAMUSCULAR | Status: DC | PRN

## 2019-12-21 MED ORDER — SODIUM CHLORIDE 0.9 % IV SOLN: Freq: Once | INTRAVENOUS | Status: AC

## 2019-12-21 NOTE — Progress Notes (Signed)
  Diagnosis: COVID-19  Physician: Dr. Shan Levans   Procedure: Medication fact sheet provided to patient; all questions answered.  Allergies reviewed with patient.  IV placed.  Bamlanivimab/etesevimab administered via IV infusion.   Complications: No immediate complications noted.  BP elevated- patient agrees to message PCP today regarding BP, as patient has been on amlodipine 2.5 mg, historically.    Discharge: Discharged home   Gregary Signs 12/21/2019

## 2019-12-21 NOTE — Discharge Instructions (Signed)
10 Things You Can Do to Manage Your COVID-19 Symptoms at Home If you have possible or confirmed COVID-19: 1. Stay home from work and school. And stay away from other public places. If you must go out, avoid using any kind of public transportation, ridesharing, or taxis. 2. Monitor your symptoms carefully. If your symptoms get worse, call your healthcare provider immediately. 3. Get rest and stay hydrated. 4. If you have a medical appointment, call the healthcare provider ahead of time and tell them that you have or may have COVID-19. 5. For medical emergencies, call 911 and notify the dispatch personnel that you have or may have COVID-19. 6. Cover your cough and sneezes with a tissue or use the inside of your elbow. 7. Wash your hands often with soap and water for at least 20 seconds or clean your hands with an alcohol-based hand sanitizer that contains at least 60% alcohol. 8. As much as possible, stay in a specific room and away from other people in your home. Also, you should use a separate bathroom, if available. If you need to be around other people in or outside of the home, wear a mask. 9. Avoid sharing personal items with other people in your household, like dishes, towels, and bedding. 10. Clean all surfaces that are touched often, like counters, tabletops, and doorknobs. Use household cleaning sprays or wipes according to the label instructions. cdc.gov/coronavirus 07/06/2018 This information is not intended to replace advice given to you by your health care provider. Make sure you discuss any questions you have with your health care provider. Document Revised: 12/08/2018 Document Reviewed: 12/08/2018 Elsevier Patient Education  2020 Elsevier Inc. What types of side effects do monoclonal antibody drugs cause?  Common side effects  In general, the more common side effects caused by monoclonal antibody drugs include: . Allergic reactions, such as hives or itching . Flu-like signs and  symptoms, including chills, fatigue, fever, and muscle aches and pains . Nausea, vomiting . Diarrhea . Skin rashes . Low blood pressure   The CDC is recommending patients who receive monoclonal antibody treatments wait at least 90 days before being vaccinated.  Currently, there are no data on the safety and efficacy of mRNA COVID-19 vaccines in persons who received monoclonal antibodies or convalescent plasma as part of COVID-19 treatment. Based on the estimated half-life of such therapies as well as evidence suggesting that reinfection is uncommon in the 90 days after initial infection, vaccination should be deferred for at least 90 days, as a precautionary measure until additional information becomes available, to avoid interference of the antibody treatment with vaccine-induced immune responses. If you have any questions or concerns after the infusion please call the Advanced Practice Provider on call at [phone]. This number is ONLY intended for your use regarding questions or concerns about the infusion post-treatment side-effects.  Please do not provide this number to others for use. For return to work notes please contact your primary care provider.   If someone you know is interested in receiving treatment please have them call the COVID hotline at [phone].   

## 2019-12-21 NOTE — Progress Notes (Signed)
Patient reviewed Fact Sheet for Patients, Parents, and Caregivers for Emergency Use Authorization (EUA) of bamlanivimab/etesevimab for the Treatment of Coronavirus.  Patient also reviewed and is agreeable to the estimated cost of treatment.  Patient is agreeable to proceed.  

## 2020-02-01 IMAGING — US US BREAST*R* LIMITED INC AXILLA
1 series · 5 of 5 positions shown · non-contrast
Comparison: Previous exam(s).

CLINICAL DATA: Lump in the right breast

EXAM:
ULTRASOUND OF THE RIGHT BREAST

[Series 1: us breast*right* limited inc axilla · 0.07mm/px · 5 of 5 slices shown]
[im 1/5]
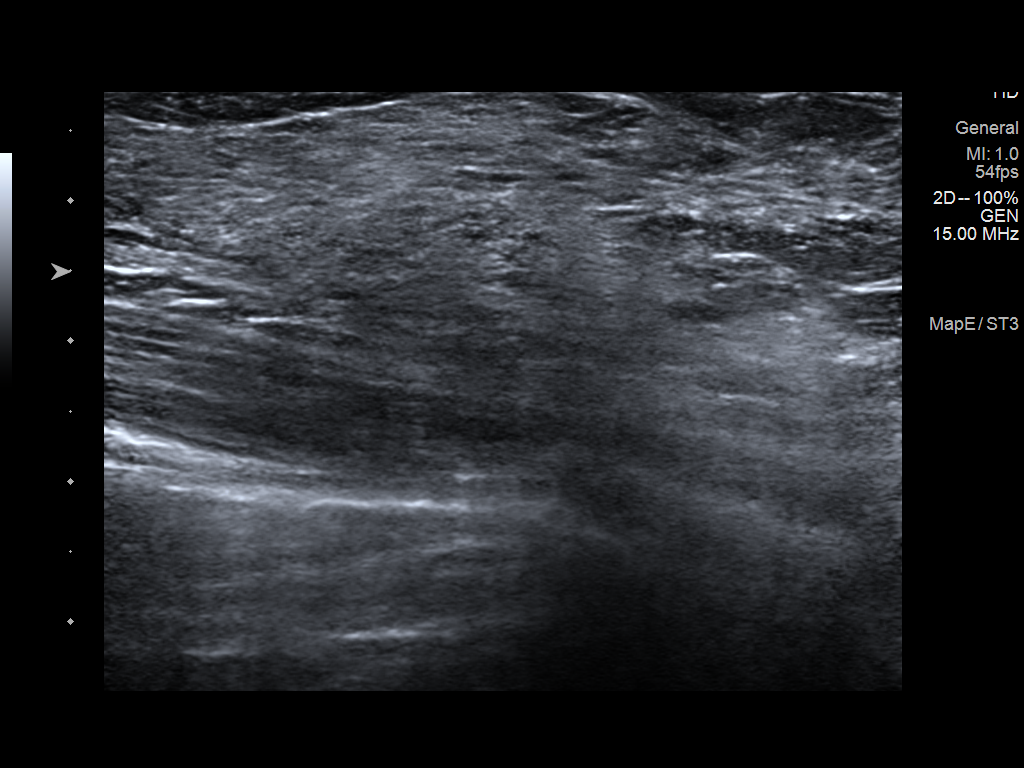
[im 2/5]
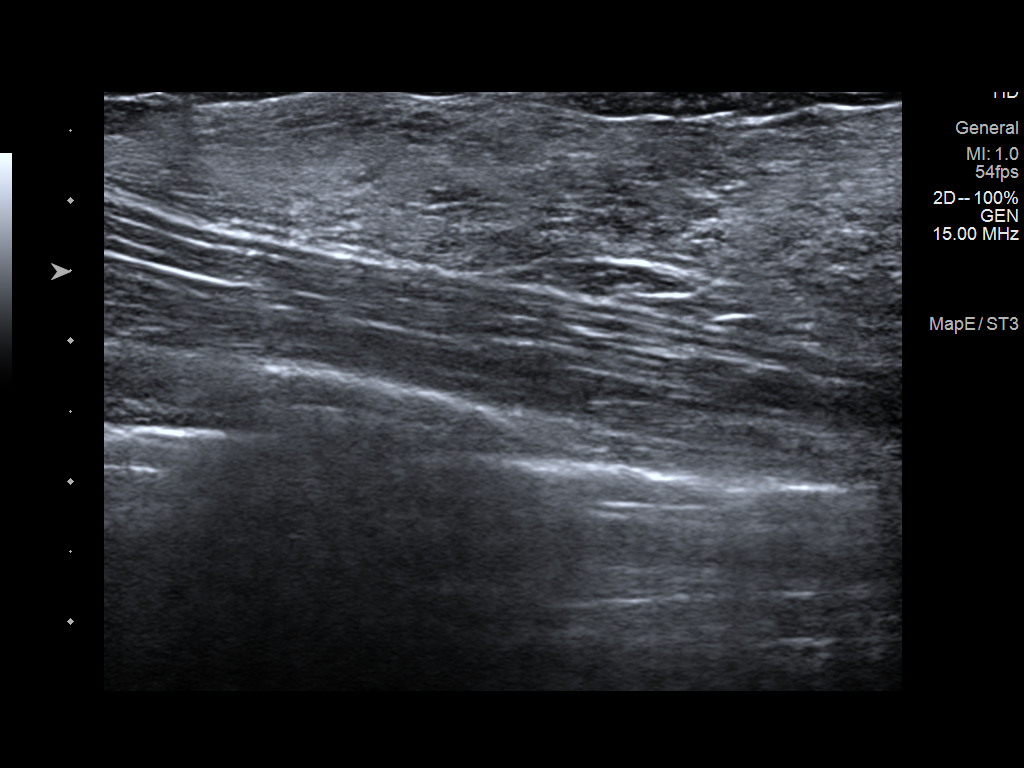
[im 3/5]
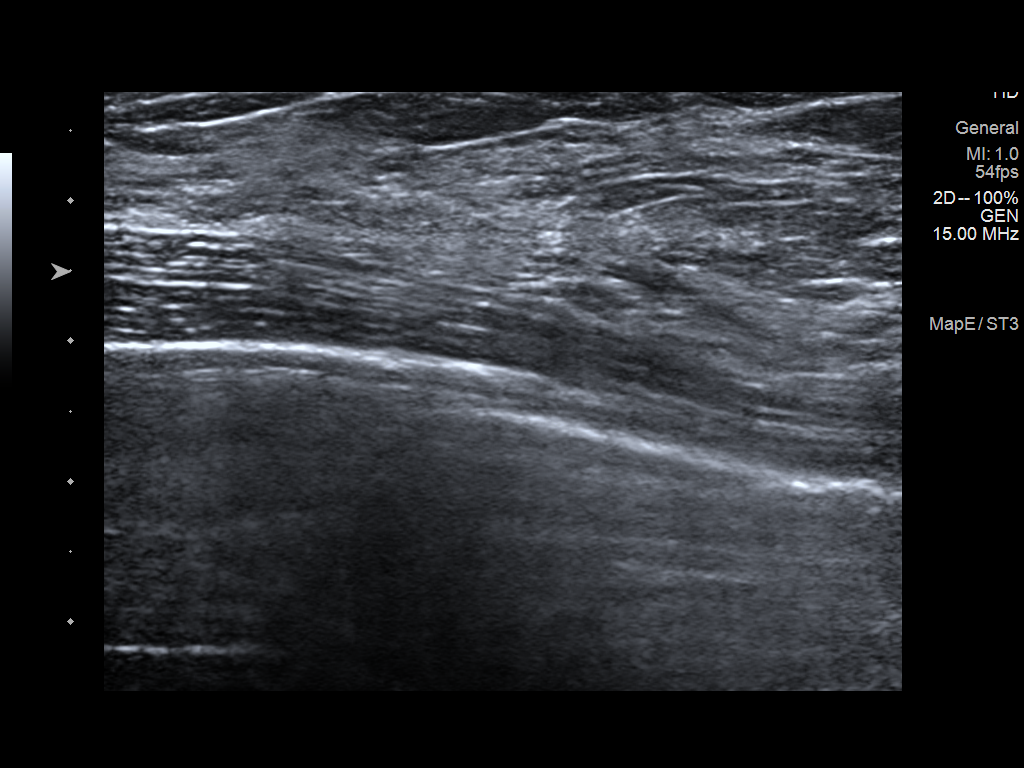
[im 4/5]
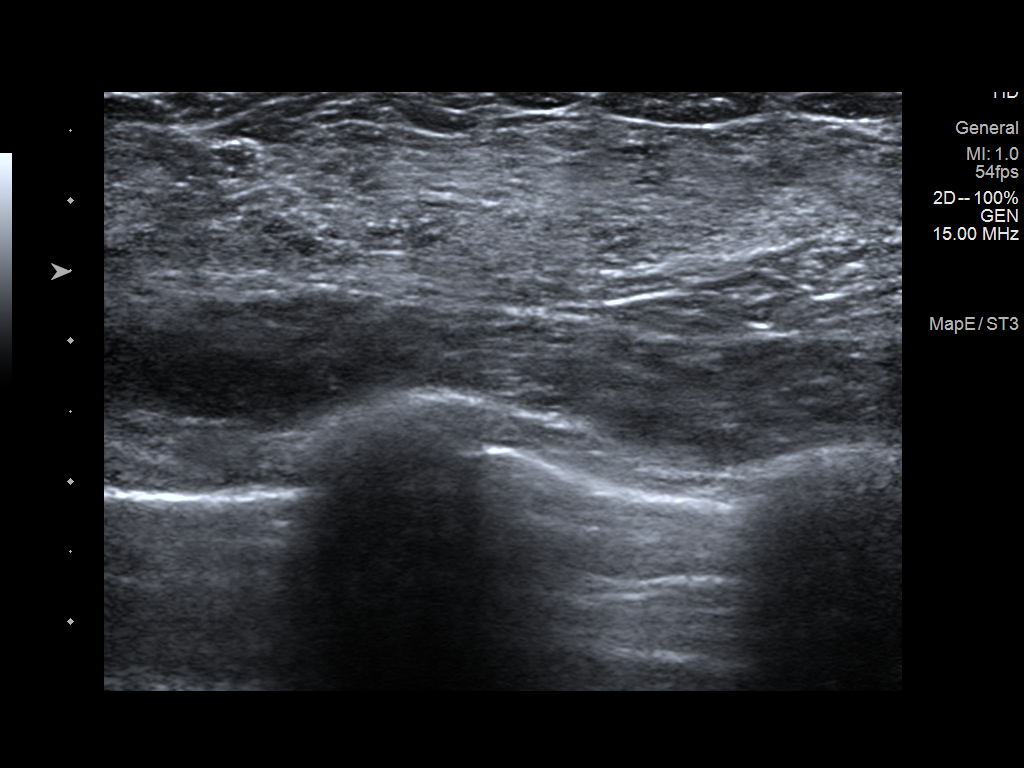
[im 5/5]
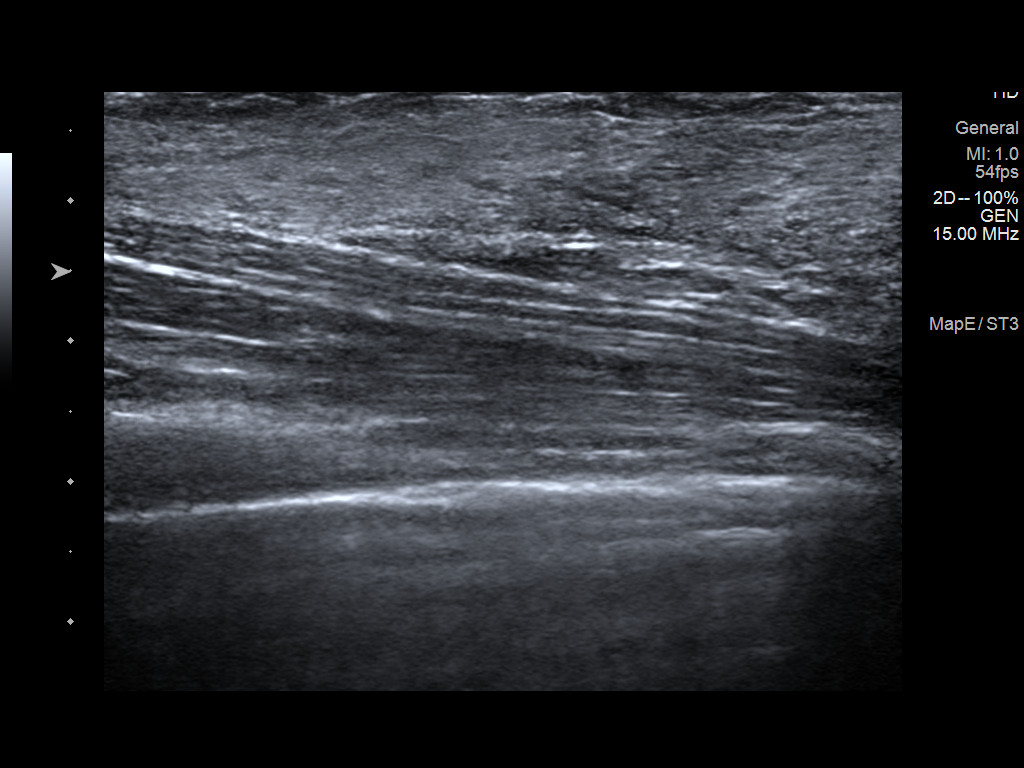

[5 of 5 positions shown; findings below may reference images not displayed]

FINDINGS: On physical exam, no suspicious lumps identified.

Targeted ultrasound is performed, showing normal dense glandular
tissue.
IMPRESSION: The patient appears to be feeling normal dense glandular tissue. No
evidence of malignancy.

RECOMMENDATION:
Treatment of the patient's symptoms should be based on clinical and
physical exam given lack of imaging findings. Recommend annual
screening mammography beginning at the age of 40.

I have discussed the findings and recommendations with the patient.
Results were also provided in writing at the conclusion of the
visit. If applicable, a reminder letter will be sent to the patient
regarding the next appointment.

BI-RADS CATEGORY  1: Negative.

## 2021-02-24 ENCOUNTER — Encounter: Payer: Self-pay | Admitting: Cardiovascular Disease

## 2021-04-29 ENCOUNTER — Ambulatory Visit (HOSPITAL_BASED_OUTPATIENT_CLINIC_OR_DEPARTMENT_OTHER): Payer: 59 | Admitting: Cardiovascular Disease

## 2021-05-19 ENCOUNTER — Institutional Professional Consult (permissible substitution): Payer: 59 | Admitting: Plastic Surgery

## 2021-07-28 IMAGING — DX DG CHEST 2V
2 series · 2 of 2 positions shown · non-contrast
Comparison: None.

CLINICAL DATA: Left-sided chest pain, no known injury

EXAM:
CHEST - 2 VIEW

[chest pa]
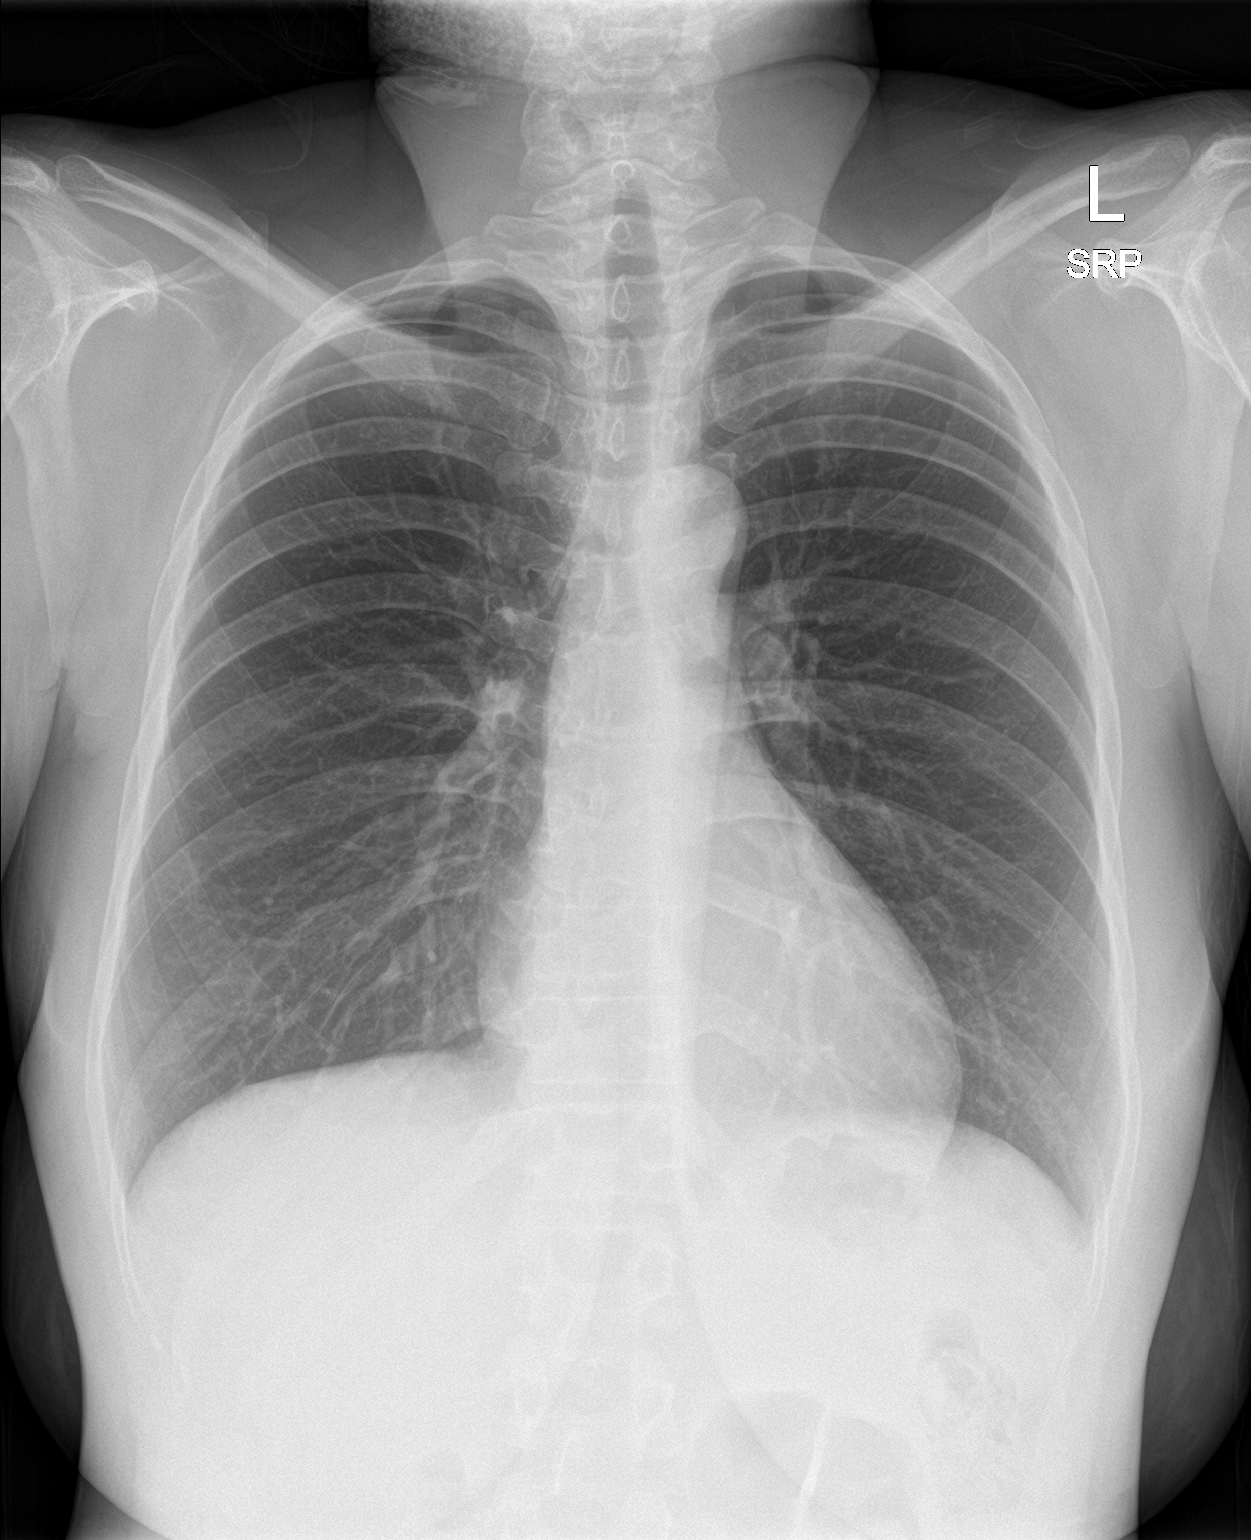

[chest lat]
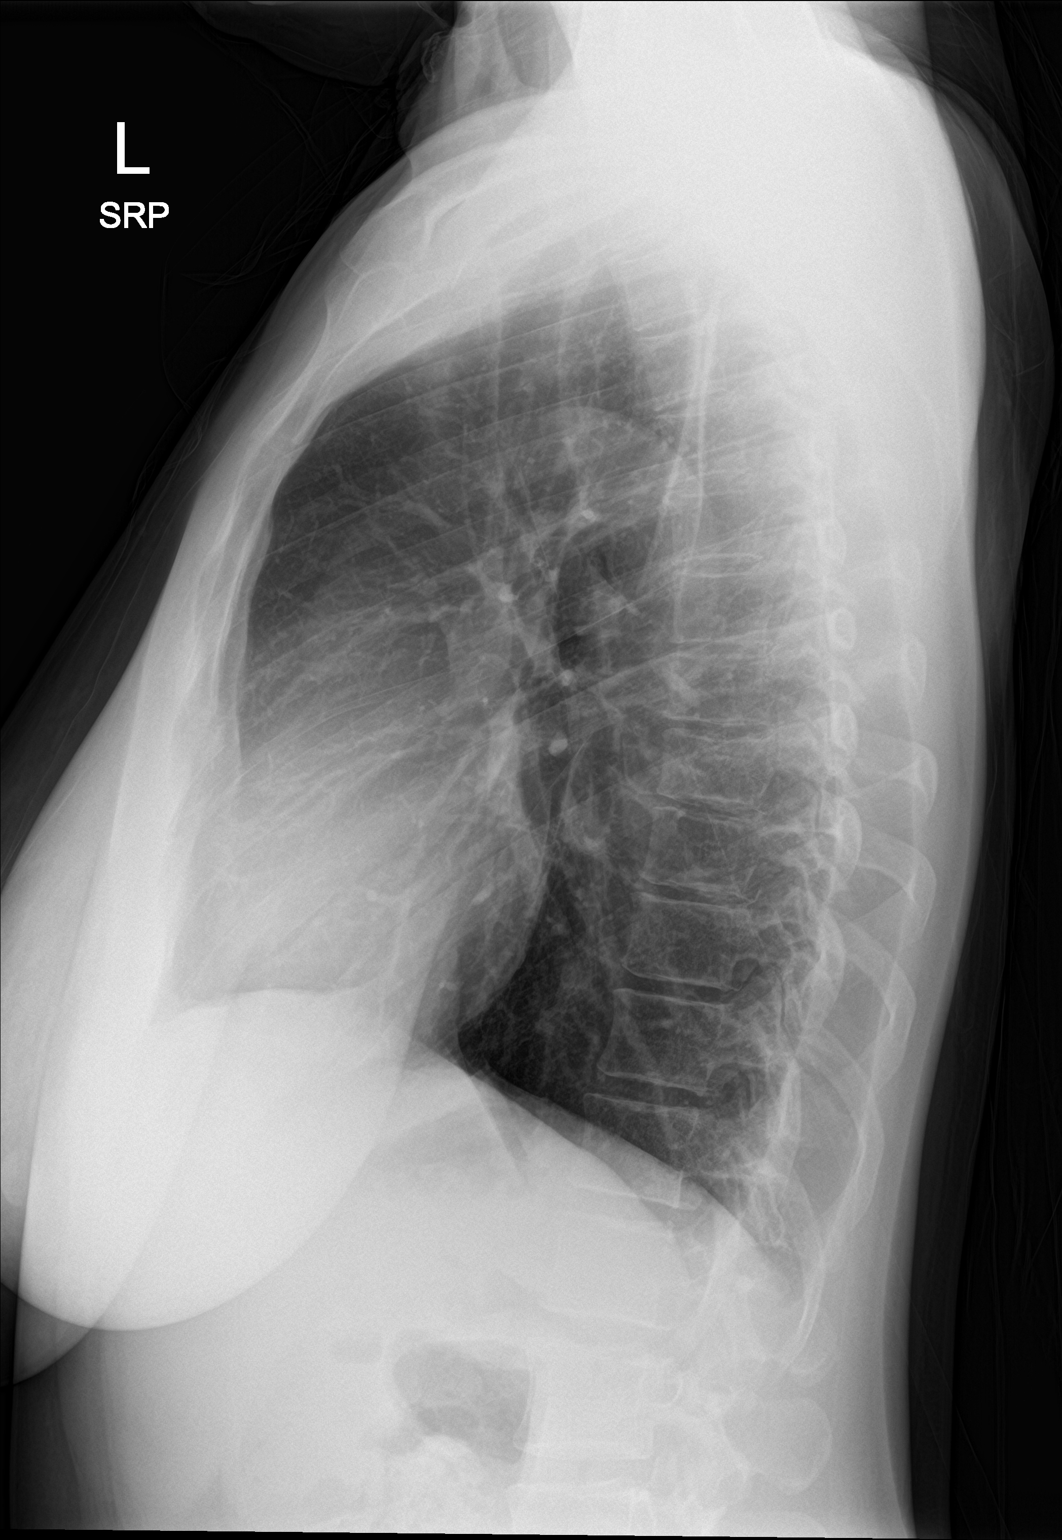

[2 of 2 positions shown; findings below may reference images not displayed]

FINDINGS: The heart size and mediastinal contours are within normal limits.
Both lungs are clear. The visualized skeletal structures are
unremarkable.
IMPRESSION: No acute abnormality of the lungs.

## 2021-07-30 ENCOUNTER — Telehealth (HOSPITAL_BASED_OUTPATIENT_CLINIC_OR_DEPARTMENT_OTHER): Payer: Self-pay

## 2021-07-30 NOTE — Telephone Encounter (Signed)
Records requested from Dr. Cherly Hensen office for appt with Dr. Duke Salvia on 08/04/21.

## 2021-08-04 ENCOUNTER — Ambulatory Visit (HOSPITAL_BASED_OUTPATIENT_CLINIC_OR_DEPARTMENT_OTHER): Payer: 59 | Admitting: Cardiovascular Disease

## 2021-08-18 ENCOUNTER — Encounter: Payer: Self-pay | Admitting: Cardiovascular Disease

## 2021-12-10 ENCOUNTER — Institutional Professional Consult (permissible substitution): Payer: 59 | Admitting: Plastic Surgery

## 2022-01-14 ENCOUNTER — Institutional Professional Consult (permissible substitution): Payer: 59 | Admitting: Plastic Surgery

## 2023-05-20 ENCOUNTER — Other Ambulatory Visit: Payer: Self-pay

## 2023-05-20 ENCOUNTER — Encounter (HOSPITAL_BASED_OUTPATIENT_CLINIC_OR_DEPARTMENT_OTHER): Payer: Self-pay | Admitting: Emergency Medicine

## 2023-05-20 ENCOUNTER — Emergency Department (HOSPITAL_BASED_OUTPATIENT_CLINIC_OR_DEPARTMENT_OTHER)
Admission: EM | Admit: 2023-05-20 | Discharge: 2023-05-20 | Disposition: A | Discharge status: Home / Self Care | Attending: Emergency Medicine | Admitting: Emergency Medicine

## 2023-05-20 DIAGNOSIS — L509 Urticaria, unspecified: Secondary | ICD-10-CM | POA: Diagnosis not present

## 2023-05-20 DIAGNOSIS — R21 Rash and other nonspecific skin eruption: Secondary | ICD-10-CM | POA: Diagnosis present

## 2023-05-20 MED ORDER — PREDNISONE 50 MG PO TABS
50.0000 mg | ORAL_TABLET | Freq: Once | ORAL | Status: AC
  Administered 2023-05-20: 50 mg via ORAL
  Filled 2023-05-20: qty 1

## 2023-05-20 MED ORDER — PREDNISONE 50 MG PO TABS
50.0000 mg | ORAL_TABLET | Freq: Every day | ORAL | 0 refills | Status: AC
Start: 2023-05-20 — End: 2023-05-24

## 2023-05-20 MED ORDER — DIPHENHYDRAMINE HCL 25 MG PO CAPS
25.0000 mg | ORAL_CAPSULE | Freq: Once | ORAL | Status: AC
  Administered 2023-05-20: 25 mg via ORAL
  Filled 2023-05-20: qty 1

## 2023-05-20 MED ORDER — CETIRIZINE HCL 10 MG PO TABS: 10.0000 mg | ORAL_TABLET | Freq: Every day | ORAL | 0 refills | Status: AC

## 2023-05-20 NOTE — Discharge Instructions (Addendum)
 You were seen today for urticaria mostly caused by the clindamycin.  I have given you a prednisone  today which should help with the rash that you are experiencing currently.  I will also have you take this for the next 4 days starting tomorrow.  You can also take cetirizine with this medication which will help with the pruritic nature of this rash.  Continue to monitor for signs of fever, pain, lesions on lips or in mouth or vaginal area, shortness of breath, difficulty swallowing and if present return to the ED for immediate evaluation.

## 2023-05-20 NOTE — ED Triage Notes (Addendum)
 Patient report rash on stomach and chest for 2-3 days. Treating at home with benadryl  and calamine lotion but rash has not gone away.  Patient reports that she recently started clindamycin for BV on Friday - unsure if this is from new medication. Finished dose of clindamycin 2 days ago. Denies shortness of breath, chest pain, or difficulty breathing.

## 2023-05-20 NOTE — ED Provider Notes (Signed)
 [place] EMERGENCY DEPARTMENT AT [facility] Provider Note   CSN: [id number] Arrival date & time: 05/20/23  4098     History  Chief Complaint  Patient presents with   Urticaria    Denise Key is a 33 y.o. female.   Urticaria  Patient is a 33 year old female presents ED today with complaints of urticaria across her chest, neck, abdomen, arms bilaterally that is not present for the last 3 days after she had finished her course of clindamycin for recurrent BV.  Patient was at the rashes mildly pruritic, nonpainful, nonbullous and in maculopapular distribution.  Has been treating with, calamine lotion.   Denies any lesions in mouth, lips, vaginal area Denies new detergents, exposures, perfumes, body washes, lotions. Denies fevers, chest pain, shortness of breath, visual changes, headache, cough, malaise/chills, dysuria, vaginal discharge     Home Medications Prior to Admission medications   Medication Sig Start Date End Date Taking? Authorizing Provider  cetirizine (ZYRTEC ALLERGY) 10 MG tablet Take 1 tablet (10 mg total) by mouth daily. 05/20/23 06/19/23 Yes Hayes Lipps, PA-C  predniSONE  (DELTASONE ) 50 MG tablet Take 1 tablet (50 mg total) by mouth daily for 4 days. 05/20/23 05/24/23 Yes Hayes Lipps, PA-C  amLODipine (NORVASC) 2.5 MG tablet Take 2.5 mg by mouth daily. 12/18/18   [provider]  Cholecalciferol (VITAMIN D) 2000 UNITS tablet Take 2,000 Units by mouth daily.    [provider]  Ferrous Sulfate  Dried (SLOW RELEASE IRON) 140 (45 FE) MG TBCR Take 1 tablet by mouth 2 (two) times daily.    [provider]  ibuprofen  (ADVIL ,MOTRIN ) 600 MG tablet Take 1 tablet (600 mg total) by mouth every 6 (six) hours. 07/19/13   Kit Peri, CNM  MAGNESIUM PO Take by mouth.    [provider]  ocrelizumab (OCREVUS) 300 MG/10ML injection Inject into the vein.    [provider]  Omega-3 1000 MG CAPS Take by mouth.     [provider]  Prenatal Vit-Fe Fumarate-FA (PRENATAL MULTIVITAMIN) TABS tablet Take 1 tablet by mouth daily at 12 noon.    [provider]  XULANE 150-35 MCG/24HR transdermal patch  02/09/19   [provider]      Allergies    Clindamycin/lincomycin    Review of Systems   Review of Systems  Skin:  Positive for rash.  All other systems reviewed and are negative.   Physical Exam Updated Vital Signs BP (!) 145/88 (BP Location: Right Arm)   Pulse 83   Temp 98.2 F (36.8 C)   Resp 16   Ht 5' 7.5" (1.715 m)   Wt 78 kg   SpO2 99%   BMI 26.54 kg/m  Physical Exam Vitals and nursing note reviewed.  Constitutional:      General: She is not in acute distress.    Appearance: Normal appearance. She is not ill-appearing.  HENT:     Head: Normocephalic and atraumatic.  Eyes:     General: No scleral icterus.       Right eye: No discharge.        Left eye: No discharge.     Extraocular Movements: Extraocular movements intact.     Conjunctiva/sclera: Conjunctivae normal.     Pupils: Pupils are equal, round, and reactive to light.  Cardiovascular:     Rate and Rhythm: Normal rate and regular rhythm.     Pulses: Normal pulses.     Heart sounds: Normal heart sounds. No murmur heard.  No friction rub. No gallop.  Pulmonary:     Effort: Pulmonary effort is normal. No respiratory distress.     Breath sounds: Normal breath sounds. No stridor. No wheezing, rhonchi or rales.  Abdominal:     General: Abdomen is flat. There is no distension.     Palpations: Abdomen is soft.     Tenderness: There is no abdominal tenderness. There is no right CVA tenderness, left CVA tenderness or guarding.  Musculoskeletal:     Right lower leg: No edema.  Skin:    General: Skin is warm and dry.     Findings: Rash present.  Neurological:     General: No focal deficit present.     Mental Status: She is alert and oriented to person, place, and time. Mental status is at baseline.      Sensory: No sensory deficit.     Motor: No weakness.     Coordination: Coordination normal.  Psychiatric:        Mood and Affect: Mood normal.     ED Results / Procedures / Treatments   Labs (all labs ordered are listed, but only abnormal results are displayed) Labs Reviewed - No data to display  EKG None  Radiology No results found.  Procedures Procedures    Medications Ordered in ED Medications  predniSONE  (DELTASONE ) tablet 50 mg (has no administration in time range)  diphenhydrAMINE  (BENADRYL ) capsule 25 mg (25 mg Oral Given 05/20/23 1953)    ED Course/ Medical Decision Making/ A&P                                 Medical Decision Making  This patient is a 33 year old female who presents to the ED for concern of rash over chest, abdomen, neck, arms bilaterally present x 3 days after finishing course of clindamycin.   On physical exam, patient is in no acute distress, afebrile, alert and orient x 4, speaking in full sentences, nontachypneic, nontachycardic.  Noted to have a maculopapular rash over chest, abdomen, arms bilaterally and neck.  Picture in chart.  Oropharynx is clear with no rashes or lesions noted in the oropharynx or over lips.  PERRL, EOMs intact with no signs of lesions and no pain with movement.  Visual acuity unaffected.  Unremarkable exam otherwise.  Presentation most likely urticaria hypersensitivity reaction to the clindamycin that she recently took.  No mucosal involvement and systemic symptoms that would indicate SJS or TN at this time.  With no recent changes to her routine or products, low suspicion for contact dermatitis.  Will have her continue to do prednisone  as well as Zyrtec for pruritus.  Will have her follow-up for any new or worsening symptoms.  Patient vital signs have remained stable throughout the course of patient's time in the ED. Low suspicion for any other emergent pathology at this time. I believe this patient is safe to be  discharged. Provided strict return to ER precautions. Patient expressed agreement and understanding of plan. All questions were answered.  Differential diagnoses prior to evaluation: Urticaria, drug-induced reaction, SJS, TEN, contact dermatitis, viral exanthem, miliaria, eczema, anaphylaxis  Past Medical History / Social History / Additional history: Chart reviewed. Pertinent results include: MS and recurrent BV  Medications / Treatment: Treat with cetirizine and prednisone    Disposition: After consideration of the diagnostic results and the patients response to treatment, I feel that the patient benefit from discharge and treatment as above.  emergency department workup does not suggest an emergent condition requiring admission or immediate intervention beyond what has been performed at this time. The plan is: Prednisone , cetirizine for rash and continue to monitor symptoms, return to the ED for any new or worsening symptoms. The patient is safe for discharge and has been instructed to return immediately for worsening symptoms, change in symptoms or any other concerns.  Final Clinical Impression(s) / ED Diagnoses Final diagnoses:  Urticaria    Rx / DC Orders ED Discharge Orders          Ordered    predniSONE  (DELTASONE ) 50 MG tablet  Daily        05/20/23 2245    cetirizine (ZYRTEC ALLERGY) 10 MG tablet  Daily        05/20/23 2245              Octivia Canion S, PA-C 05/20/23 2248    Rafael Bun A, DO 05/21/23 1347

## 2023-06-05 ENCOUNTER — Other Ambulatory Visit: Payer: Self-pay

## 2023-06-05 ENCOUNTER — Encounter (HOSPITAL_COMMUNITY): Payer: Self-pay

## 2023-06-05 ENCOUNTER — Emergency Department (HOSPITAL_COMMUNITY)
Admission: EM | Admit: 2023-06-05 | Discharge: 2023-06-05 | Disposition: A | Discharge status: Home / Self Care | Attending: Emergency Medicine | Admitting: Emergency Medicine

## 2023-06-05 DIAGNOSIS — R21 Rash and other nonspecific skin eruption: Secondary | ICD-10-CM | POA: Diagnosis present
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
## 2023-06-05 NOTE — ED Notes (Signed)
 Discharge instructions reviewed.   Opportunity for questions and concerns provided.   Alert, oriented and ambulatory.   Displays no signs of distress.   Escorted out by [employer] ( not in custody).

## 2023-06-05 NOTE — ED Triage Notes (Signed)
 Arrives via [employer] (not in custody) with rash on left of neck.   Pt says rash has been ongoing since previous visit 2 weeks ago.   Compliant on antibiotics.

## 2023-06-05 NOTE — Discharge Instructions (Signed)
 You are seen in the ER today for medical clearance.,  There is no sign of emergent condition today, continue taking your medication as prescribed for your rash and come back to the ER for new or worsening symptoms.

## 2023-10-08 ENCOUNTER — Ambulatory Visit: Admitting: Podiatry

## 2023-10-27 ENCOUNTER — Ambulatory Visit (INDEPENDENT_AMBULATORY_CARE_PROVIDER_SITE_OTHER): Admitting: Podiatry

## 2023-10-27 ENCOUNTER — Ambulatory Visit

## 2023-10-27 DIAGNOSIS — Q666 Other congenital valgus deformities of feet: Secondary | ICD-10-CM

## 2023-10-27 DIAGNOSIS — M21611 Bunion of right foot: Secondary | ICD-10-CM

## 2023-10-27 DIAGNOSIS — Z01818 Encounter for other preprocedural examination: Secondary | ICD-10-CM

## 2023-10-27 DIAGNOSIS — M21619 Bunion of unspecified foot: Secondary | ICD-10-CM

## 2023-10-27 NOTE — Progress Notes (Signed)
 Subjective:  Patient ID: Denise Key, female    DOB: November 29, 1990,  MRN: [id number]  Chief Complaint  Patient presents with   Bunions    Here to discuss bilateral bunions having pain with certain shoes x 6 months. Pain 7. Not diabetic no anti coag    33 y.o. female presents with the above complaint.  Patient presents with right moderate bunion deformity that has been painful for quite some time she tried shoe gear modification padding offloading nothing has helped.  Her pain scale is 7 out of 10 dull aching nature she wishes to undergo surgical intervention at this time.  She denies any other acute issues.    Review of Systems: Negative except as noted in the HPI. Denies N/V/F/Ch.  Past Medical History:  Diagnosis Date   Multiple sclerosis    Postpartum care following vaginal delivery (7/13) 07/18/2013    Current Outpatient Medications:    amLODipine (NORVASC) 2.5 MG tablet, Take 2.5 mg by mouth daily., Disp: , Rfl:    cetirizine  (ZYRTEC  ALLERGY) 10 MG tablet, Take 1 tablet (10 mg total) by mouth daily., Disp: 30 tablet, Rfl: 0   Cholecalciferol (VITAMIN D) 2000 UNITS tablet, Take 2,000 Units by mouth daily., Disp: , Rfl:    Ferrous Sulfate  Dried (SLOW RELEASE IRON) 140 (45 FE) MG TBCR, Take 1 tablet by mouth 2 (two) times daily., Disp: , Rfl:    ibuprofen  (ADVIL ,MOTRIN ) 600 MG tablet, Take 1 tablet (600 mg total) by mouth every 6 (six) hours., Disp: 30 tablet, Rfl: 0   MAGNESIUM PO, Take by mouth., Disp: , Rfl:    ocrelizumab (OCREVUS) 300 MG/10ML injection, Inject into the vein., Disp: , Rfl:    Omega-3 1000 MG CAPS, Take by mouth., Disp: , Rfl:    Prenatal Vit-Fe Fumarate-FA (PRENATAL MULTIVITAMIN) TABS tablet, Take 1 tablet by mouth daily at 12 noon., Disp: , Rfl:    XULANE 150-35 MCG/24HR transdermal patch, , Disp: , Rfl:   Social History   Tobacco Use  Smoking Status Former   Current packs/day: 0.00   Types: Cigarettes   Quit date: 11/27/2012   Years since quitting:  10.9  Smokeless Tobacco Never    Allergies  Allergen Reactions   Clindamycin/Lincomycin Hives   Objective:  There were no vitals filed for this visit. There is no height or weight on file to calculate BMI. Constitutional Well developed. Well nourished.  Vascular Dorsalis pedis pulses palpable bilaterally. Posterior tibial pulses palpable bilaterally. Capillary refill normal to all digits.  No cyanosis or clubbing noted. Pedal hair growth normal.  Neurologic Normal speech. Oriented to person, place, and time. Epicritic sensation to light touch grossly present bilaterally.  Dermatologic Nails well groomed and normal in appearance. No open wounds. No skin lesions.  Orthopedic: Normal joint ROM without pain or crepitus bilaterally. Hallux abductovalgus deformity present this is a tracking not a track bound deformity no intra-articular first MPJ pain noted. Left 1st MPJ diminished range of motion. Left 1st TMT without gross hypermobility. Right 1st MPJ diminished range of motion  Right 1st TMT without gross hypermobility. Lesser digital contractures present right.   Radiographs: Taken and reviewed. Hallux abductovalgus deformity present. Metatarsal parabola normal. 1st/2nd IMA: Moderate; TSP: 5.  Patient presents with hallux interphalangeus angle of 13 degrees.  Assessment:   1. Bunion    Plan:  Patient was evaluated and treated and all questions answered.  Hallux abductovalgus deformity, right foot -XR as above. -Patient has failed all conservative therapy and wishes to proceed  with surgical intervention. All risks, benefits, and alternatives discussed with patient. No guarantees given. Consent reviewed and signed by patient. Post-op course explained at length. -Planned procedures: Minimally invasive bunionectomy versus open bunionectomy with possible phalangeal osteotomy with fixation -Risk factors: None - I discussed my preoperative IntraOp postoperative plan with the  patient in extensive detail.  She states understanding like to proceed with surgery. -Informed surgical risk consent was reviewed and read aloud to the patient.  I reviewed the films.  I have discussed my findings with the patient in great detail.  I have discussed all risks including but not limited to infection, stiffness, scarring, limp, disability, deformity, damage to blood vessels and nerves, numbness, poor healing, need for braces, arthritis, chronic pain, amputation, death.  All benefits and realistic expectations discussed in great detail.  I have made no promises as to the outcome.  I have provided realistic expectations.  I have offered the patient a 2nd opinion, which they have declined and assured me they preferred to proceed despite the risks  Pes planovalgus/foot deformity -I explained to patient the etiology of pes planovalgus and relationship with heel pain/arch pain and various treatment options were discussed.  Given patient foot structure in the setting of heel pain/arch pain I believe patient will benefit from custom-made orthotics to help control the hindfoot motion support the arch of the foot and take the stress away from arches.  Patient agrees with the plan like to proceed with orthotics -Patient was casted for orthotics    No follow-ups on file.

## 2023-11-04 ENCOUNTER — Telehealth: Payer: Self-pay | Admitting: Podiatry

## 2023-11-04 NOTE — Telephone Encounter (Signed)
 Called and left message for patient to contact office to schedule surgery.

## 2023-11-12 ENCOUNTER — Encounter: Payer: Self-pay | Admitting: Podiatry

## 2023-11-16 NOTE — Telephone Encounter (Signed)
 Patient is scheduled to come in and resign consents tomorrow 11/12

## 2023-11-17 ENCOUNTER — Other Ambulatory Visit: Payer: Self-pay | Admitting: Podiatry

## 2023-11-17 ENCOUNTER — Ambulatory Visit (INDEPENDENT_AMBULATORY_CARE_PROVIDER_SITE_OTHER): Admitting: Podiatry

## 2023-11-17 ENCOUNTER — Ambulatory Visit (INDEPENDENT_AMBULATORY_CARE_PROVIDER_SITE_OTHER)

## 2023-11-17 DIAGNOSIS — M21612 Bunion of left foot: Secondary | ICD-10-CM

## 2023-11-17 DIAGNOSIS — M21619 Bunion of unspecified foot: Secondary | ICD-10-CM | POA: Diagnosis not present

## 2023-11-17 DIAGNOSIS — Z01818 Encounter for other preprocedural examination: Secondary | ICD-10-CM

## 2023-11-17 NOTE — Progress Notes (Addendum)
 [facility]  Subjective:  Patient ID: Denise Key, female    DOB: [birth year],  MRN: [id number]  No chief complaint on file.   33 y.o. female presents with the above complaint.  Patient presents with left moderate bunion deformity that has been painful for quite some time she tried shoe gear modification padding offloading nothing has helped.  Her pain scale is 7 out of 10 dull aching nature she wishes to undergo surgical intervention at this time.  She denies any other acute issues.  She has tried also conservative care include shoe gear modification padding protecting offloading she wishes to undergo surgical intervention  Review of Systems: Negative except as noted in the HPI. Denies N/V/F/Ch.  Past Medical History:  Diagnosis Date   Multiple sclerosis    Postpartum care following vaginal delivery (7/13) 07/18/2013    Current Outpatient Medications:    amLODipine (NORVASC) 2.5 MG tablet, Take 2.5 mg by mouth daily., Disp: , Rfl:    cetirizine  (ZYRTEC  ALLERGY) 10 MG tablet, Take 1 tablet (10 mg total) by mouth daily., Disp: 30 tablet, Rfl: 0   Cholecalciferol (VITAMIN D) 2000 UNITS tablet, Take 2,000 Units by mouth daily., Disp: , Rfl:    Ferrous Sulfate  Dried (SLOW RELEASE IRON) 140 (45 FE) MG TBCR, Take 1 tablet by mouth 2 (two) times daily., Disp: , Rfl:    ibuprofen  (ADVIL ,MOTRIN ) 600 MG tablet, Take 1 tablet (600 mg total) by mouth every 6 (six) hours., Disp: 30 tablet, Rfl: 0   MAGNESIUM PO, Take by mouth., Disp: , Rfl:    ocrelizumab (OCREVUS) 300 MG/10ML injection, Inject into the vein., Disp: , Rfl:    Omega-3 1000 MG CAPS, Take by mouth., Disp: , Rfl:    Prenatal Vit-Fe Fumarate-FA (PRENATAL MULTIVITAMIN) TABS tablet, Take 1 tablet by mouth daily at 12 noon., Disp: , Rfl:    XULANE 150-35 MCG/24HR transdermal patch, , Disp: , Rfl:   Social History   Tobacco Use  Smoking Status Former   Current packs/day: 0.00   Types: Cigarettes   Quit date: 11/27/2012   Years since  quitting: 10.9  Smokeless Tobacco Never    Allergies  Allergen Reactions   Clindamycin/Lincomycin Hives   Objective:  There were no vitals filed for this visit. There is no height or weight on file to calculate BMI. Constitutional Well developed. Well nourished.  Vascular Dorsalis pedis pulses palpable bilaterally. Posterior tibial pulses palpable bilaterally. Capillary refill normal to all digits.  No cyanosis or clubbing noted. Pedal hair growth normal.  Neurologic Normal speech. Oriented to person, place, and time. Epicritic sensation to light touch grossly present bilaterally.  Dermatologic Nails well groomed and normal in appearance. No open wounds. No skin lesions.  Orthopedic: Normal joint ROM without pain or crepitus bilaterally. Hallux abductovalgus deformity present this is a tracking not a track bound deformity no intra-articular first MPJ pain noted. Left 1st MPJ diminished range of motion. Left 1st TMT without gross hypermobility. Right 1st MPJ diminished range of motion  Right 1st TMT without gross hypermobility. Lesser digital contractures present right.   Radiographs: Taken and reviewed. Hallux abductovalgus deformity present. Metatarsal parabola normal. 1st/2nd IMA: Moderate; TSP: 5.  Patient presents with hallux interphalangeus angle of 12 degrees.  Assessment:   1. Bunion   2. Encounter for preoperative examination for general surgical procedure    Plan:  Patient was evaluated and treated and all questions answered.  Hallux abductovalgus deformity, left foot -XR as above. -Patient has failed all conservative therapy and  wishes to proceed with surgical intervention. All risks, benefits, and alternatives discussed with patient. No guarantees given. Consent reviewed and signed by patient. Post-op course explained at length. -Planned procedures: Minimally invasive bunionectomy versus open bunionectomy with possible phalangeal osteotomy with fixation -Risk  factors: None - I discussed my preoperative IntraOp postoperative plan with the patient in extensive detail.  She states understanding like to proceed with surgery. -Informed surgical risk consent was reviewed and read aloud to the patient.  I reviewed the films.  I have discussed my findings with the patient in great detail.  I have discussed all risks including but not limited to infection, stiffness, scarring, limp, disability, deformity, damage to blood vessels and nerves, numbness, poor healing, need for braces, arthritis, chronic pain, amputation, death.  All benefits and realistic expectations discussed in great detail.  I have made no promises as to the outcome.  I have provided realistic expectations.  I have offered the patient a 2nd opinion, which they have declined and assured me they preferred to proceed despite the risks  Pes planovalgus/foot deformity -I explained to patient the etiology of pes planovalgus and relationship with heel pain/arch pain and various treatment options were discussed.  Given patient foot structure in the setting of heel pain/arch pain I believe patient will benefit from custom-made orthotics to help control the hindfoot motion support the arch of the foot and take the stress away from arches.  Patient agrees with the plan like to proceed with orthotics -Patient was casted for orthotics    No follow-ups on file.

## 2023-11-23 NOTE — Telephone Encounter (Signed)
 Called and left message that we had received cancellation request and gave her direct line to reschedule when ready

## 2023-11-24 ENCOUNTER — Telehealth: Payer: Self-pay

## 2023-11-24 NOTE — Telephone Encounter (Signed)
 Called patient to schedule a [facility] appointment. Unable to connect with patient and did LVM to have patient call us  back to schedule [facility] appointment.

## 2023-12-15 NOTE — Telephone Encounter (Signed)
 Second attempt on trying to reach patient and schedule an appointment to [facility]. LVM

## 2024-01-24 ENCOUNTER — Other Ambulatory Visit

## 2024-01-28 ENCOUNTER — Other Ambulatory Visit
